# Patient Record
Sex: Male | Born: 1965 | Race: White | Hispanic: No | Marital: Single | Smoking: Current every day smoker
Health system: Southern US, Community
[De-identification: ages and names within clinical notes are randomized; demographics above are authoritative.]

## PROBLEM LIST (undated history)

## (undated) DIAGNOSIS — M1990 Unspecified osteoarthritis, unspecified site: Secondary | ICD-10-CM

## (undated) DIAGNOSIS — K219 Gastro-esophageal reflux disease without esophagitis: Secondary | ICD-10-CM

## (undated) DIAGNOSIS — C801 Malignant (primary) neoplasm, unspecified: Secondary | ICD-10-CM

## (undated) DIAGNOSIS — F419 Anxiety disorder, unspecified: Secondary | ICD-10-CM

## (undated) DIAGNOSIS — F32A Depression, unspecified: Secondary | ICD-10-CM

## (undated) DIAGNOSIS — IMO0002 Reserved for concepts with insufficient information to code with codable children: Secondary | ICD-10-CM

## (undated) DIAGNOSIS — C4490 Unspecified malignant neoplasm of skin, unspecified: Secondary | ICD-10-CM

## (undated) HISTORY — DX: Unspecified malignant neoplasm of skin, unspecified: C44.90

## (undated) HISTORY — DX: Anxiety disorder, unspecified: F41.9

## (undated) HISTORY — DX: Gastro-esophageal reflux disease without esophagitis: K21.9

## (undated) HISTORY — DX: Malignant (primary) neoplasm, unspecified: C80.1

## (undated) HISTORY — DX: Reserved for concepts with insufficient information to code with codable children: IMO0002

## (undated) HISTORY — DX: Depression, unspecified: F32.A

## (undated) HISTORY — PX: TONSILLECTOMY: SUR1361

## (undated) HISTORY — DX: Unspecified osteoarthritis, unspecified site: M19.90

---

## 2007-12-31 ENCOUNTER — Emergency Department: Payer: Self-pay | Admitting: Internal Medicine
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
## 2009-08-09 IMAGING — CT CT HEAD WITHOUT CONTRAST
2 series · 15 of 30 positions shown, 19 images · non-contrast
Comparison: none

REASON FOR EXAM: bruising/pain
COMMENTS:

PROCEDURE:     CT  - CT HEAD WITHOUT CONTRAST  - December 31, 2007  [DATE]
RESULT:     Comparison: No comparison
TECHNIQUE: Multiple axial images from the foramen magnum to the vertex were
obtained without IV contrast.

[Series 2: without · axial · non-contrast · 0.41mm/px · z∈[+688,+824]mm · 13 of 33 slices shown, 17 images]
[im 3/33  brain]
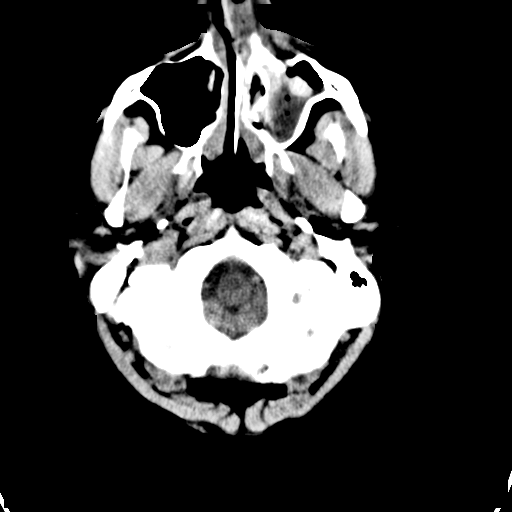
[im 3/33  bone]
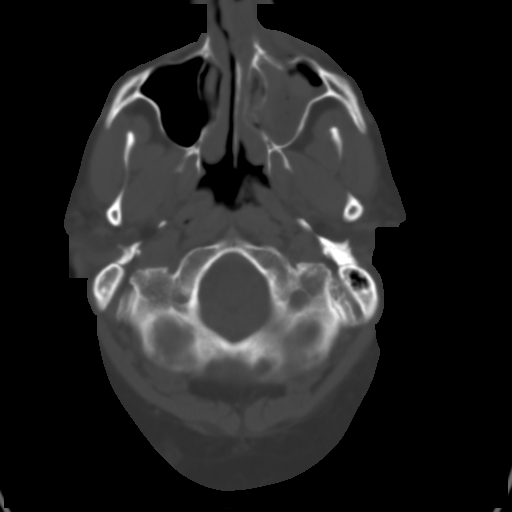
[im 5/33  brain]
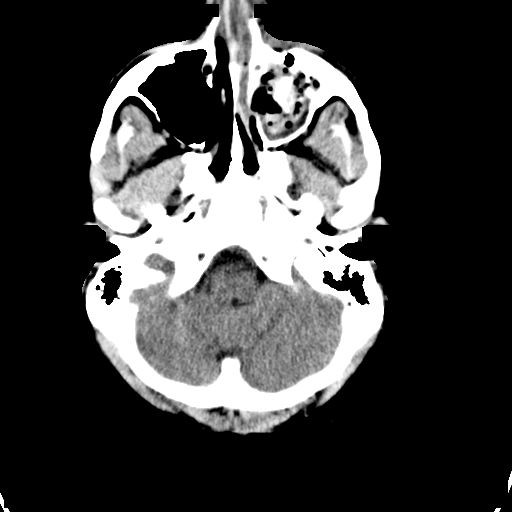
[im 7/33  brain]
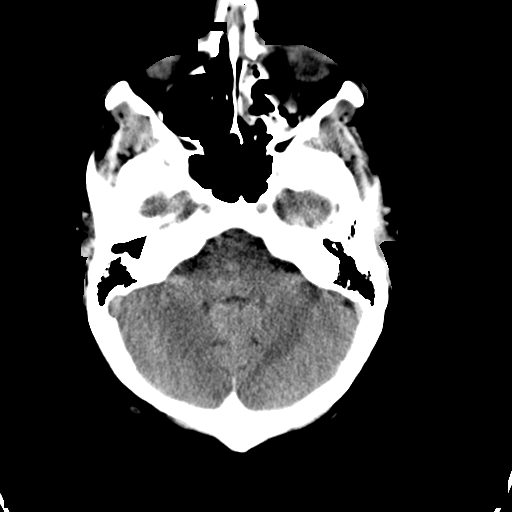
[im 10/33  brain]
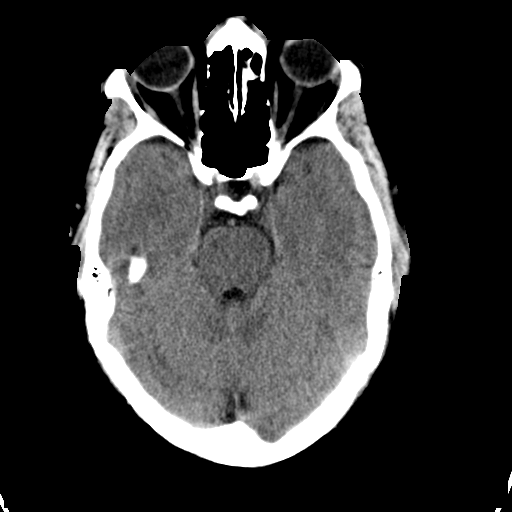
[im 12/33  brain]
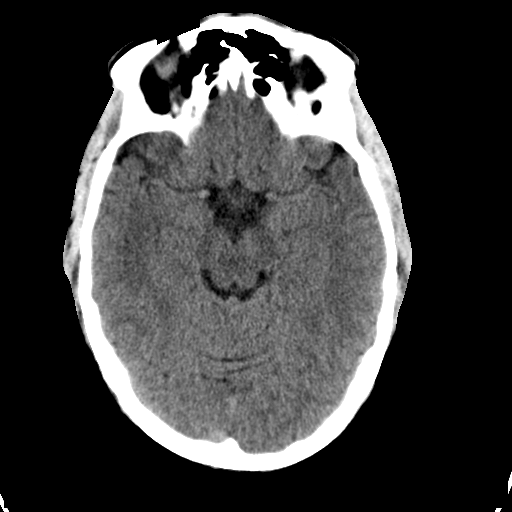
[im 12/33  bone]
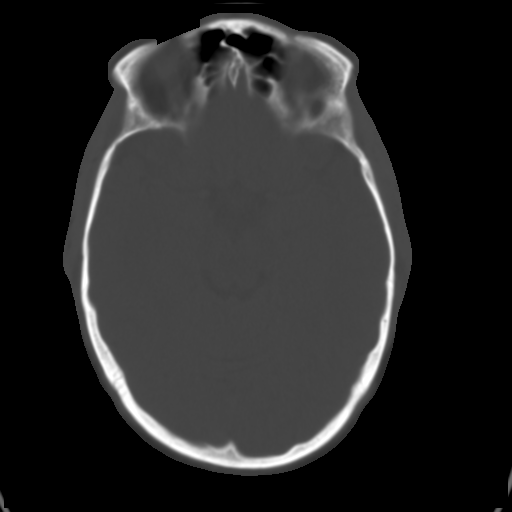
[im 14/33  brain]
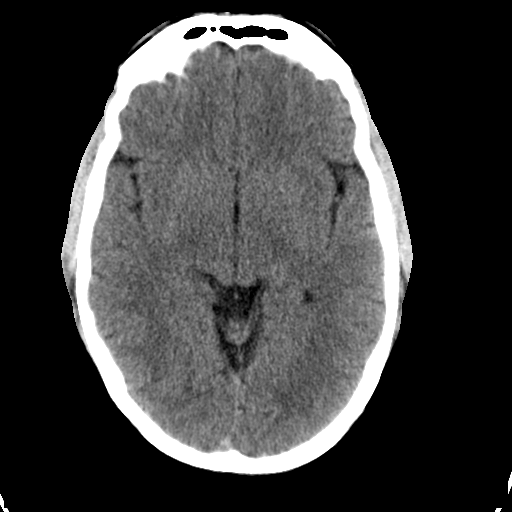
[im 17/33  brain]
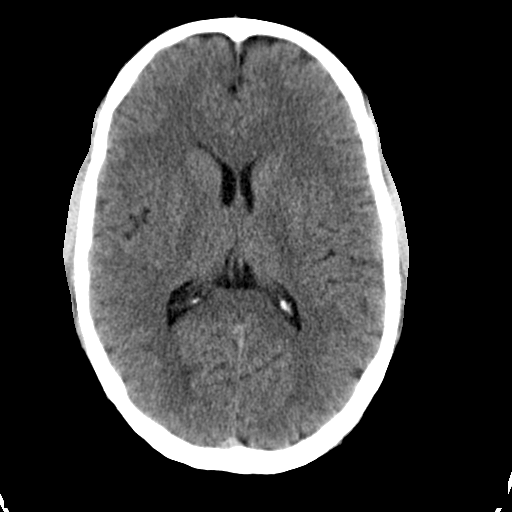
[im 19/33  brain]
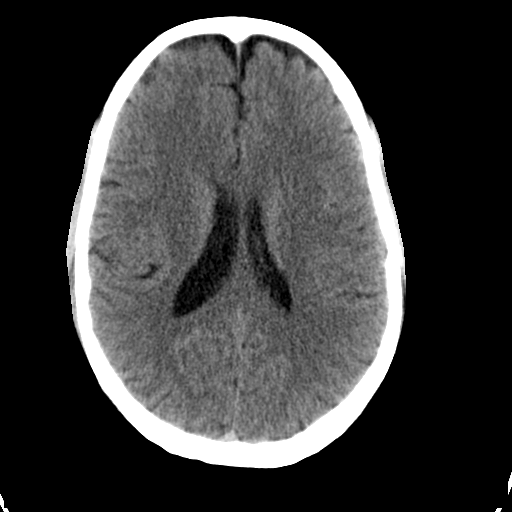
[im 21/33  brain]
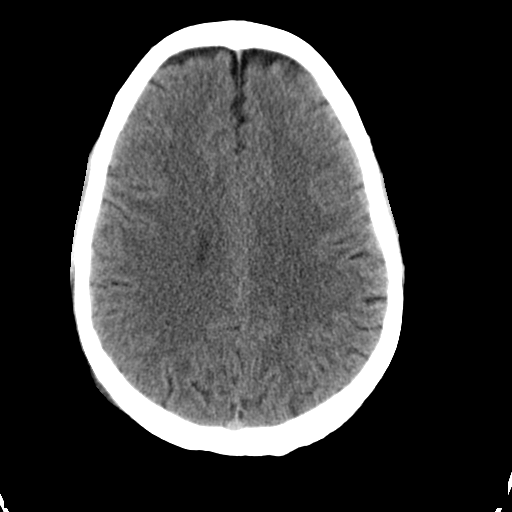
[im 21/33  bone]
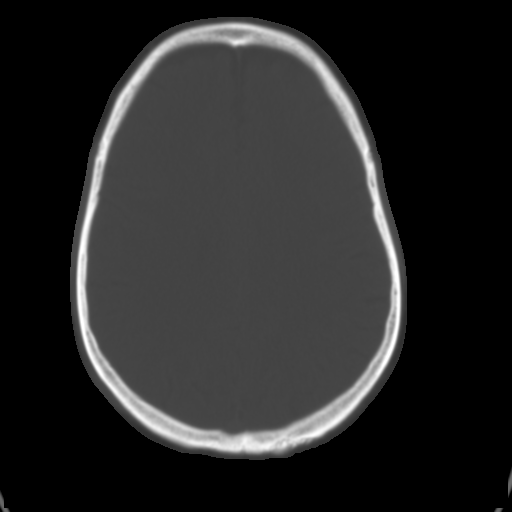
[im 23/33  brain]
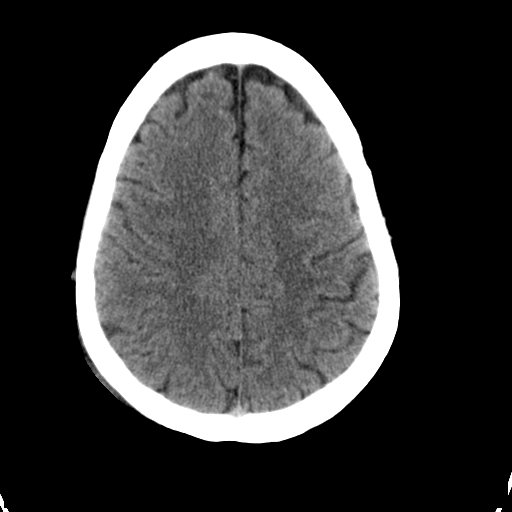
[im 26/33  brain]
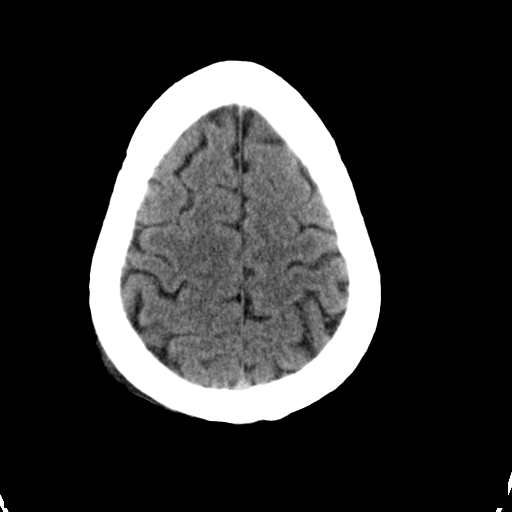
[im 28/33  brain]
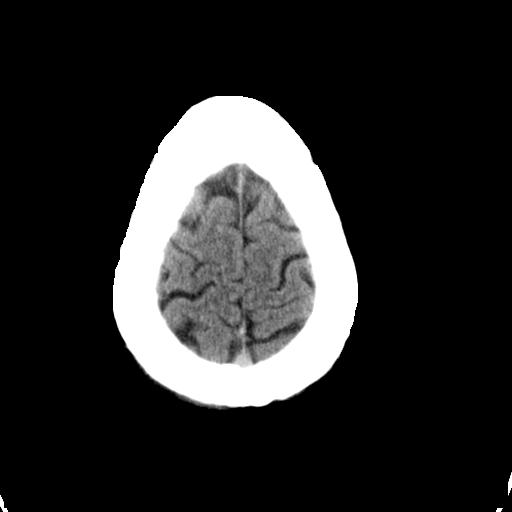
[im 30/33  brain]
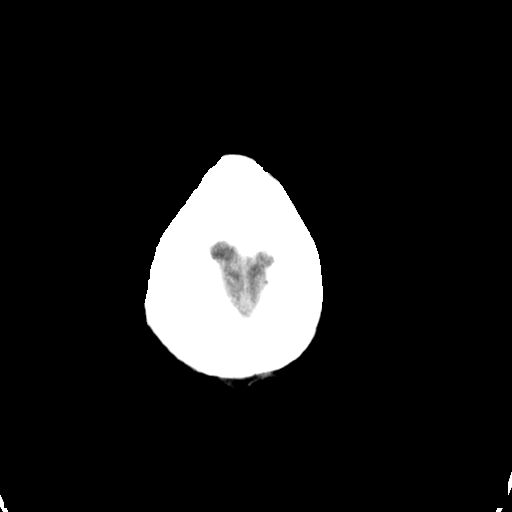
[im 30/33  bone]
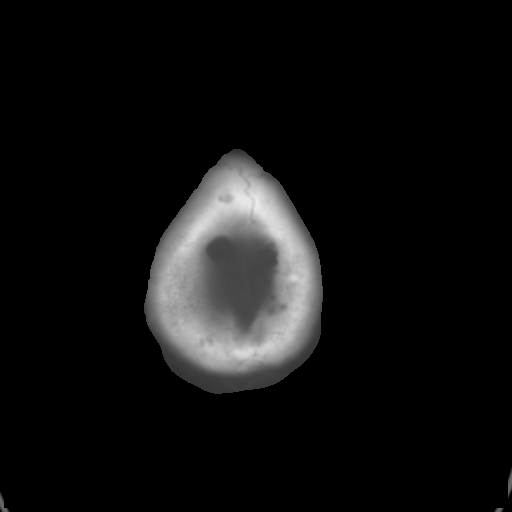

[Series 3: bone · axial · 0.41mm/px · z∈[+688,+708]mm · 2 of 33 slices shown]
[im 3/33  bone]
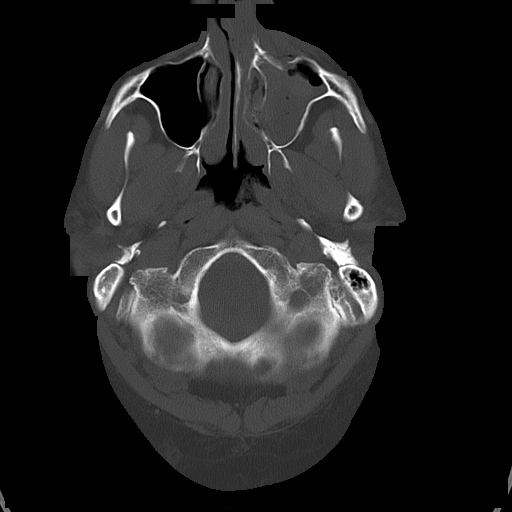
[im 7/33  bone]
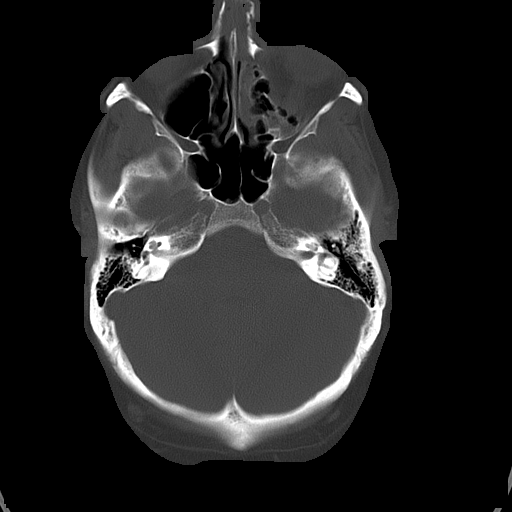

[15 of 30 positions shown; findings below may reference images not displayed]

FINDINGS: There is no evidence for mass effect, midline shift, or extra-axial fluid
collections.  There is no evidence for space-occupying lesion or
intracranial hemorrhage. There is no evidence for a cortical-based area of
acute infarction.

Ventricles and sulci are appropriate for the patient's age. The basal
cisterns are patent.

Visualized portions of the orbits are unremarkable. The paranasal sinuses
and mastoid air cells are unremarkable.

There is a fracture of the medial and anterior left maxillary walls. There
is near-complete opacification of the left maxillary sinus. There is a
fracture of the left nasal bone. There is a right parietal scalp hematoma.
IMPRESSION: No acute intracranial process.

## 2022-09-12 ENCOUNTER — Ambulatory Visit (INDEPENDENT_AMBULATORY_CARE_PROVIDER_SITE_OTHER): Payer: Medicaid Other | Admitting: Physician Assistant

## 2022-09-12 ENCOUNTER — Encounter: Payer: Self-pay | Admitting: Physician Assistant

## 2022-09-12 VITALS — BP 110/80 | HR 73 | Ht 69.0 in | Wt 179.0 lb

## 2022-09-12 DIAGNOSIS — Z13220 Encounter for screening for lipoid disorders: Secondary | ICD-10-CM

## 2022-09-12 DIAGNOSIS — Z0000 Encounter for general adult medical examination without abnormal findings: Secondary | ICD-10-CM

## 2022-09-12 DIAGNOSIS — Z0001 Encounter for general adult medical examination with abnormal findings: Secondary | ICD-10-CM | POA: Diagnosis not present

## 2022-09-12 DIAGNOSIS — F17200 Nicotine dependence, unspecified, uncomplicated: Secondary | ICD-10-CM

## 2022-09-12 DIAGNOSIS — Z114 Encounter for screening for human immunodeficiency virus [HIV]: Secondary | ICD-10-CM

## 2022-09-12 DIAGNOSIS — Z125 Encounter for screening for malignant neoplasm of prostate: Secondary | ICD-10-CM | POA: Diagnosis not present

## 2022-09-12 DIAGNOSIS — Z1159 Encounter for screening for other viral diseases: Secondary | ICD-10-CM | POA: Diagnosis not present

## 2022-09-12 DIAGNOSIS — Z122 Encounter for screening for malignant neoplasm of respiratory organs: Secondary | ICD-10-CM

## 2022-09-12 DIAGNOSIS — Z1211 Encounter for screening for malignant neoplasm of colon: Secondary | ICD-10-CM

## 2022-09-12 DIAGNOSIS — Z0120 Encounter for dental examination and cleaning without abnormal findings: Secondary | ICD-10-CM

## 2022-09-12 DIAGNOSIS — F314 Bipolar disorder, current episode depressed, severe, without psychotic features: Secondary | ICD-10-CM

## 2022-09-12 DIAGNOSIS — Z1321 Encounter for screening for nutritional disorder: Secondary | ICD-10-CM | POA: Diagnosis not present

## 2022-09-12 DIAGNOSIS — F322 Major depressive disorder, single episode, severe without psychotic features: Secondary | ICD-10-CM

## 2022-09-12 MED ORDER — BUPROPION HCL ER (SR) 150 MG PO TB12
150.0000 mg | ORAL_TABLET | Freq: Two times a day (BID) | ORAL | 2 refills | Status: DC
Start: 2022-09-12 — End: 2022-09-26

## 2022-09-12 NOTE — Progress Notes (Unsigned)
    Date:  09/12/2022   Name:  Frank Kelley   DOB:  1965/09/16   MRN:  [id number]   Chief Complaint: Establish Care  HPI    Medication list has been reviewed and updated.  No outpatient medications have been marked as taking for the 09/12/22 encounter (Office Visit) with Remo Lipps, PA.     Review of Systems  There are no problems to display for this patient.   Not on File   There is no immunization history on file for this patient.  History reviewed. No pertinent surgical history.  Social History   Tobacco Use   Smoking status: Every Day    Current packs/day: 1.00    Average packs/day: 1 pack/day for 43.0 years (43.0 ttl pk-yrs)    Types: Cigarettes    History reviewed. No pertinent family history.       No data to display              No data to display          BP Readings from Last 3 Encounters:  No data found for BP    Wt Readings from Last 3 Encounters:  No data found for Wt    There were no vitals taken for this visit.  Physical Exam  Recent Labs  No results found for: "NA", "K", "CL", "CO2", "GLUCOSE", "BUN", "CREATININE", "CALCIUM", "PROT", "ALBUMIN", "AST", "ALT", "ALKPHOS", "BILITOT", "GFRNONAA", "GFRAA"  No results found for: "WBC", "HGB", "HCT", "MCV", "PLT" No results found for: "HGBA1C" No results found for: "CHOL", "HDL", "LDLCALC", "LDLDIRECT", "TRIG", "CHOLHDL" No results found for: "TSH"   Assessment and Plan:  There are no diagnoses linked to this encounter.   No follow-ups on file.    Alvester Morin, PA-C, DMSc, Nutritionist [facility] [phone]

## 2022-09-12 NOTE — Patient Instructions (Signed)

## 2022-09-13 ENCOUNTER — Other Ambulatory Visit: Payer: Self-pay | Admitting: Physician Assistant

## 2022-09-13 DIAGNOSIS — F330 Major depressive disorder, recurrent, mild: Secondary | ICD-10-CM | POA: Insufficient documentation

## 2022-09-13 DIAGNOSIS — E782 Mixed hyperlipidemia: Secondary | ICD-10-CM

## 2022-09-13 DIAGNOSIS — F4000 Agoraphobia, unspecified: Secondary | ICD-10-CM | POA: Insufficient documentation

## 2022-09-13 DIAGNOSIS — F322 Major depressive disorder, single episode, severe without psychotic features: Secondary | ICD-10-CM | POA: Insufficient documentation

## 2022-09-13 DIAGNOSIS — F411 Generalized anxiety disorder: Secondary | ICD-10-CM | POA: Insufficient documentation

## 2022-09-13 DIAGNOSIS — F40298 Other specified phobia: Secondary | ICD-10-CM | POA: Insufficient documentation

## 2022-09-13 DIAGNOSIS — D72829 Elevated white blood cell count, unspecified: Secondary | ICD-10-CM | POA: Insufficient documentation

## 2022-09-13 DIAGNOSIS — F314 Bipolar disorder, current episode depressed, severe, without psychotic features: Secondary | ICD-10-CM | POA: Insufficient documentation

## 2022-09-13 DIAGNOSIS — D72828 Other elevated white blood cell count: Secondary | ICD-10-CM

## 2022-09-13 MED ORDER — ATORVASTATIN CALCIUM 10 MG PO TABS
10.0000 mg | ORAL_TABLET | Freq: Every day | ORAL | 1 refills | Status: DC
Start: 2022-09-13 — End: 2022-10-10

## 2022-09-14 ENCOUNTER — Telehealth: Payer: Self-pay | Admitting: Physician Assistant

## 2022-09-14 NOTE — Telephone Encounter (Signed)
Please see previous note.

## 2022-09-26 ENCOUNTER — Telehealth: Payer: Self-pay

## 2022-09-26 ENCOUNTER — Other Ambulatory Visit: Payer: Self-pay | Admitting: Physician Assistant

## 2022-09-26 DIAGNOSIS — F411 Generalized anxiety disorder: Secondary | ICD-10-CM

## 2022-09-26 MED ORDER — HYDROXYZINE PAMOATE 25 MG PO CAPS
25.0000 mg | ORAL_CAPSULE | Freq: Three times a day (TID) | ORAL | 2 refills | Status: DC | PRN
Start: 2022-09-26 — End: 2023-05-23

## 2022-09-26 NOTE — Telephone Encounter (Signed)
Pt called referrals team and left message that he stopped taking Wellbutrin after taking it for 7 days. He stated he did not like the way it made him feel once he started taking 2 tablets instead of one. He stated it made everything worse.   blob:[email or web address] for [email or web address]  KP

## 2022-10-10 ENCOUNTER — Ambulatory Visit (INDEPENDENT_AMBULATORY_CARE_PROVIDER_SITE_OTHER): Payer: Medicaid Other | Admitting: Physician Assistant

## 2022-10-10 ENCOUNTER — Other Ambulatory Visit: Payer: Self-pay

## 2022-10-10 ENCOUNTER — Encounter: Payer: Self-pay | Admitting: Physician Assistant

## 2022-10-10 VITALS — BP 122/68 | HR 68 | Temp 98.1°F | Ht 69.0 in | Wt 180.0 lb

## 2022-10-10 DIAGNOSIS — F330 Major depressive disorder, recurrent, mild: Secondary | ICD-10-CM

## 2022-10-10 DIAGNOSIS — F17200 Nicotine dependence, unspecified, uncomplicated: Secondary | ICD-10-CM

## 2022-10-10 DIAGNOSIS — L570 Actinic keratosis: Secondary | ICD-10-CM | POA: Diagnosis not present

## 2022-10-10 DIAGNOSIS — Z122 Encounter for screening for malignant neoplasm of respiratory organs: Secondary | ICD-10-CM

## 2022-10-10 DIAGNOSIS — Z23 Encounter for immunization: Secondary | ICD-10-CM

## 2022-10-10 DIAGNOSIS — E782 Mixed hyperlipidemia: Secondary | ICD-10-CM | POA: Diagnosis not present

## 2022-10-10 DIAGNOSIS — Z87891 Personal history of nicotine dependence: Secondary | ICD-10-CM

## 2022-10-10 DIAGNOSIS — D1801 Hemangioma of skin and subcutaneous tissue: Secondary | ICD-10-CM | POA: Diagnosis not present

## 2022-10-10 DIAGNOSIS — D72829 Elevated white blood cell count, unspecified: Secondary | ICD-10-CM

## 2022-10-10 DIAGNOSIS — F17210 Nicotine dependence, cigarettes, uncomplicated: Secondary | ICD-10-CM

## 2022-10-10 MED ORDER — ATORVASTATIN CALCIUM 10 MG PO TABS: 10.0000 mg | ORAL_TABLET | Freq: Every day | ORAL | 1 refills | Status: DC

## 2022-10-10 MED ORDER — ATORVASTATIN CALCIUM 10 MG PO TABS
10.0000 mg | ORAL_TABLET | Freq: Every day | ORAL | 1 refills | Status: DC
Start: 2022-10-10 — End: 2022-11-07

## 2022-10-10 NOTE — Progress Notes (Signed)
Date:  10/10/2022   Name:  Frank Kelley   DOB:  Aug 08, 1965   MRN:  [id number]   Chief Complaint: Hyperlipidemia and Anxiety  HPI Frank Kelley presents for 1 month follow-up on mood disorders and smoking.  We started him on bupropion with the hope that it would benefit both his depression and help quit smoking, but unfortunately he was unable to tolerate the medication so we switched to as needed hydroxyzine, which seems to be doing well for the time being.  He does not feel ready to quit smoking.  He does however feel much better about his anxiety and depression compared to last visit.  I also ordered LDCT and Cologuard last visit.  He has the Cologuard kit but has not completed his sample.  He has not heard from pulmonology regarding the scheduling of his LDCT.  He had mild hyperlipidemia on his last labs, started on atorvastatin 10 mg for the cardiovascular benefit given his extensive smoking history.  Tolerating the medication well  He would like dermatology referral for a "bump" on the left upper eyelid as well as probable AK's few places on the body.   Medication list has been reviewed and updated.  Current Meds  Medication Sig   atorvastatin (LIPITOR) 10 MG tablet Take 1 tablet (10 mg total) by mouth daily.   hydrOXYzine (VISTARIL) 25 MG capsule Take 1 capsule (25 mg total) by mouth every 8 (eight) hours as needed for anxiety. Do not take with other antihistamines such as cetirizine, diphenhydramine, loratadine, etc     Review of Systems  Constitutional:  Negative for fatigue and fever.  Respiratory:  Negative for chest tightness and shortness of breath.   Cardiovascular:  Negative for chest pain and palpitations.  Gastrointestinal:  Negative for abdominal pain.  Psychiatric/Behavioral:  Positive for dysphoric mood. The patient is nervous/anxious.     Patient Active Problem List   Diagnosis Date Noted   Current severe episode of major depressive disorder without psychotic  features (HCC) 09/13/2022   Bipolar disorder with severe depression (HCC) 09/13/2022   Generalized anxiety disorder 09/13/2022   Agoraphobia 09/13/2022   Germaphobia 09/13/2022   Leucocytosis 09/13/2022   Mixed hyperlipidemia 09/13/2022   Hypercalcemia 09/13/2022   Tobacco use disorder 09/12/2022    No Known Allergies   There is no immunization history on file for this patient.  Past Surgical History:  Procedure Laterality Date   TONSILLECTOMY      Social History   Tobacco Use   Smoking status: Every Day    Current packs/day: 1.00    Average packs/day: 1 pack/day for 41.7 years (41.7 ttl pk-yrs)    Types: Cigarettes    Start date: 02/14/1981   Smokeless tobacco: Never  Vaping Use   Vaping status: Never Used  Substance Use Topics   Alcohol use: Yes    Comment: occassionally   Drug use: Never    Family History  Problem Relation Age of Onset   Diabetes Mother    Breast cancer Mother    Diabetes Father    Cancer Father    Bladder Cancer Father    Diabetes Brother         10/10/2022    8:27 AM 09/12/2022    1:57 PM  GAD 7 : Generalized Anxiety Score  Nervous, Anxious, on Edge 1 3  Control/stop worrying 1 3  Worry too much - different things 2 3  Trouble relaxing 1 3  Restless 1 3  Easily annoyed or  irritable 2 3  Afraid - awful might happen 1 3  Total GAD 7 Score 9 21  Anxiety Difficulty Somewhat difficult Extremely difficult       10/10/2022    8:26 AM 09/12/2022    1:56 PM  Depression screen PHQ 2/9  Decreased Interest 1 3  Down, Depressed, Hopeless 1 3  PHQ - 2 Score 2 6  Altered sleeping 0 2  Tired, decreased energy 0 2  Change in appetite 0 3  Feeling bad or failure about yourself  1 3  Trouble concentrating 1 3  Moving slowly or fidgety/restless 1 3  Suicidal thoughts 0 1  PHQ-9 Score 5 23  Difficult doing work/chores  Extremely dIfficult    BP Readings from Last 3 Encounters:  10/10/22 122/68  09/12/22 110/80    Wt Readings from Last  3 Encounters:  10/10/22 180 lb (81.6 kg)  09/12/22 179 lb (81.2 kg)    BP 122/68   Pulse 68   Temp 98.1 F (36.7 C) (Oral)   Ht 5\' 9"  (1.753 m)   Wt 180 lb (81.6 kg)   SpO2 98%   BMI 26.58 kg/m   Physical Exam Vitals and nursing note reviewed.  Constitutional:      Appearance: Normal appearance.  Cardiovascular:     Rate and Rhythm: Normal rate.  Pulmonary:     Effort: Pulmonary effort is normal.  Abdominal:     General: There is no distension.  Musculoskeletal:        General: Normal range of motion.  Skin:    General: Skin is warm and dry.     Comments: 2-3 mm purple papule of LEFT upper eyelid (external) bordering the lashes. Scattered red-pink dry lesions probably AK on the right forearm, left shoulder, and midline scalp.   Neurological:     Mental Status: He is alert and oriented to person, place, and time.     Gait: Gait is intact.  Psychiatric:        Mood and Affect: Mood and affect normal.     Recent Labs     Component Value Date/Time   NA 140 09/12/2022 1449   K 4.6 09/12/2022 1449   CL 100 09/12/2022 1449   CO2 26 09/12/2022 1449   GLUCOSE 93 09/12/2022 1449   BUN 18 09/12/2022 1449   CREATININE 0.95 09/12/2022 1449   CALCIUM 10.6 (H) 09/12/2022 1449   PROT 7.2 09/12/2022 1449   ALBUMIN 4.5 09/12/2022 1449   AST 21 09/12/2022 1449   ALT 20 09/12/2022 1449   ALKPHOS 114 09/12/2022 1449   BILITOT <0.2 09/12/2022 1449    Lab Results  Component Value Date   WBC 14.6 (H) 09/12/2022   HGB 16.7 09/12/2022   HCT 49.9 09/12/2022   MCV 88 09/12/2022   PLT 256 09/12/2022   No results found for: "HGBA1C" Lab Results  Component Value Date   CHOL 210 (H) 09/12/2022   HDL 54 09/12/2022   LDLCALC 133 (H) 09/12/2022   TRIG 128 09/12/2022   CHOLHDL 3.9 09/12/2022   Lab Results  Component Value Date   TSH 1.430 09/12/2022     Assessment and Plan:  1. Major depressive disorder, recurrent episode, mild (HCC) Much improved from previous despite  no significant changes in medication.  Sounds like it was mainly situational regarding initial visit to primary care.  Discussed option for pharmacotherapy with SSRI/SNRI, but patient would like to hold off at this time.  Will continue hydroxyzine for as  needed use to control anxiety.  2. Tobacco use disorder Patient does not believe he is ready to quit.  Smoking cessation education given.  Reminded that we can try NRT at any time  3. Mixed hyperlipidemia Continue atorvastatin as below - atorvastatin (LIPITOR) 10 MG tablet; Take 1 tablet (10 mg total) by mouth daily.  Dispense: 90 tablet; Refill: 1  4. Leukocytosis, unspecified type Repeat CBC today - CBC with Differential/Platelet  5. Hypercalcemia Repeat BMP today - Basic metabolic panel  6. Actinic keratoses Refer to dermatology for additional evaluation and management - Ambulatory referral to Dermatology  7. Hemangioma of eyelid Refer to dermatology for additional evaluation and management - Ambulatory referral to Dermatology  8. Need for Tdap vaccination Tdap booster completed today - Tdap vaccine greater than or equal to 7yo IM  9. Need for pneumococcal 20-valent conjugate vaccination Prevnar 20 administered today - Pneumococcal conjugate vaccine 20-valent    Follow-up in about 1 month for chronic conditions  Alvester Morin, PA-C, DMSc, Nutritionist [facility] [phone]

## 2022-10-10 NOTE — Patient Instructions (Signed)
-  It was a pleasure to see you today! Please review your visit summary for helpful information -Lab results are usually available within 1-2 days and we will call once reviewed -I would encourage you to follow your care via MyChart where you can access lab results, notes, messages, and more -If you feel that we did a nice job today, please complete your after-visit survey and leave us a Google review! Your CMA today was Kieandra and your provider was Dan Waddell, PA-C, DMSc -Please return for follow-up in about 1 month  

## 2022-10-11 ENCOUNTER — Other Ambulatory Visit: Payer: Self-pay | Admitting: Physician Assistant

## 2022-10-11 DIAGNOSIS — D72829 Elevated white blood cell count, unspecified: Secondary | ICD-10-CM

## 2022-10-11 LAB — CBC WITH DIFFERENTIAL/PLATELET
Basophils Absolute: 0.1 10*3/uL (ref 0.0–0.2)
Basos: 0 %
EOS (ABSOLUTE): 0.1 10*3/uL (ref 0.0–0.4)
Eos: 1 %
Hematocrit: 48.9 % (ref 37.5–51.0)
Hemoglobin: 15.7 g/dL (ref 13.0–17.7)
Immature Grans (Abs): 0 10*3/uL (ref 0.0–0.1)
Immature Granulocytes: 0 %
Lymphocytes Absolute: 2.5 10*3/uL (ref 0.7–3.1)
Lymphs: 19 %
MCH: 28.5 pg (ref 26.6–33.0)
MCHC: 32.1 g/dL (ref 31.5–35.7)
MCV: 89 fL (ref 79–97)
Monocytes Absolute: 0.6 10*3/uL (ref 0.1–0.9)
Monocytes: 5 %
Neutrophils Absolute: 10.1 10*3/uL — ABNORMAL HIGH (ref 1.4–7.0)
Neutrophils: 75 %
Platelets: 229 10*3/uL (ref 150–450)
RBC: 5.5 x10E6/uL (ref 4.14–5.80)
RDW: 12.2 % (ref 11.6–15.4)
WBC: 13.5 10*3/uL — ABNORMAL HIGH (ref 3.4–10.8)

## 2022-10-11 LAB — BASIC METABOLIC PANEL
BUN/Creatinine Ratio: 17 (ref 9–20)
BUN: 16 mg/dL (ref 6–24)
CO2: 26 mmol/L (ref 20–29)
Calcium: 9.4 mg/dL (ref 8.7–10.2)
Chloride: 102 mmol/L (ref 96–106)
Creatinine, Ser: 0.92 mg/dL (ref 0.76–1.27)
Glucose: 92 mg/dL (ref 70–99)
Potassium: 4.9 mmol/L (ref 3.5–5.2)
Sodium: 140 mmol/L (ref 134–144)
eGFR: 98 mL/min/{1.73_m2} (ref >59)

## 2022-10-13 ENCOUNTER — Encounter: Payer: Self-pay | Admitting: Physician Assistant

## 2022-10-13 ENCOUNTER — Ambulatory Visit: Payer: Medicaid Other | Admitting: Physician Assistant

## 2022-10-13 DIAGNOSIS — F17210 Nicotine dependence, cigarettes, uncomplicated: Secondary | ICD-10-CM

## 2022-10-13 NOTE — Progress Notes (Signed)
Virtual Visit via Telephone Note  I connected withNAME@ on 10/13/22 at  9:00 AM EDT by telephone and verified that I am speaking with the correct person using two identifiers.  Location: Patient: Home Provider: Remotely   I discussed the limitations, risks, security and privacy concerns of performing an evaluation and management service by telephone and the availability of in person appointments. I also discussed with the patient that there may be a patient responsible charge related to this service. The patient expressed understanding and agreed to proceed.    Shared Decision Making Visit Lung Cancer Screening Program [phone])   Eligibility: Age 57 y.o. Pack Years Smoking History Calculation 41 (# packs/per year x # years smoked) Recent History of coughing up blood  No Unexplained weight loss? No ( >Than 15 pounds within the last 6 months ) Prior History Lung / other cancer no (Diagnosis within the last 5 years already requiring surveillance chest CT Scans). Smoking Status Current Smoker  Visit Components:     Discussion included one or more decision making aids. Yes Discussion included risk/benefits of screening. Yes Discussion included potential follow up diagnostic testing for abnormal scans. Yes Discussion included meaning and risk of over diagnosis. Yes Discussion included meaning and risk of False Positives. Yes Discussion included meaning of total radiation exposure. Yes  Counseling Included: Importance of adherence to annual lung cancer LDCT screening. Yes Impact of comorbidities on ability to participate in the program. Yes Ability and willingness to under diagnostic treatment. Yes  Smoking Cessation Counseling: Current Smokers:  Discussed importance of smoking cessation. Yes Information about tobacco cessation classes and interventions provided to patient. Yes Symptomatic Patient. No  Counseling: NA Diagnosis Code: Tobacco Use Z72.0 Asymptomatic Patient  Yes   Counseling (Intermediate counseling: > three minutes counseling) [id number] Written Order for Lung Cancer Screening with LDCT placed in Epic. Yes (CT Chest Lung Cancer Screening Low Dose W/O CM) [id number] Z12.2-Screening of respiratory organs Z87.891-Personal history of nicotine dependence    Thereasa Iannello Celine Mans, PA-C

## 2022-10-13 NOTE — Patient Instructions (Addendum)

## 2022-10-18 ENCOUNTER — Inpatient Hospital Stay: Payer: Medicaid Other | Attending: Oncology | Admitting: Oncology

## 2022-10-18 ENCOUNTER — Encounter: Payer: Self-pay | Admitting: Oncology

## 2022-10-18 ENCOUNTER — Telehealth: Payer: Self-pay

## 2022-10-18 ENCOUNTER — Inpatient Hospital Stay: Payer: Medicaid Other

## 2022-10-18 VITALS — BP 125/89 | HR 63 | Temp 97.3°F | Resp 18 | Wt 181.1 lb

## 2022-10-18 DIAGNOSIS — Z809 Family history of malignant neoplasm, unspecified: Secondary | ICD-10-CM

## 2022-10-18 DIAGNOSIS — Z803 Family history of malignant neoplasm of breast: Secondary | ICD-10-CM | POA: Insufficient documentation

## 2022-10-18 DIAGNOSIS — Z5941 Food insecurity: Secondary | ICD-10-CM | POA: Insufficient documentation

## 2022-10-18 DIAGNOSIS — Z79899 Other long term (current) drug therapy: Secondary | ICD-10-CM | POA: Diagnosis not present

## 2022-10-18 DIAGNOSIS — D72829 Elevated white blood cell count, unspecified: Secondary | ICD-10-CM | POA: Insufficient documentation

## 2022-10-18 DIAGNOSIS — Z9089 Acquired absence of other organs: Secondary | ICD-10-CM | POA: Diagnosis not present

## 2022-10-18 DIAGNOSIS — F17200 Nicotine dependence, unspecified, uncomplicated: Secondary | ICD-10-CM

## 2022-10-18 DIAGNOSIS — Z5982 Transportation insecurity: Secondary | ICD-10-CM

## 2022-10-18 DIAGNOSIS — Z833 Family history of diabetes mellitus: Secondary | ICD-10-CM

## 2022-10-18 DIAGNOSIS — Z8052 Family history of malignant neoplasm of bladder: Secondary | ICD-10-CM

## 2022-10-18 DIAGNOSIS — F17210 Nicotine dependence, cigarettes, uncomplicated: Secondary | ICD-10-CM | POA: Insufficient documentation

## 2022-10-18 LAB — HEPATITIS B SURFACE ANTIGEN: Hepatitis B Surface Ag: NONREACTIVE

## 2022-10-18 LAB — CBC WITH DIFFERENTIAL/PLATELET
Abs Immature Granulocytes: 0.04 10*3/uL (ref 0.00–0.07)
Basophils Absolute: 0.1 10*3/uL (ref 0.0–0.1)
Basophils Relative: 1 %
Eosinophils Absolute: 0.1 10*3/uL (ref 0.0–0.5)
Eosinophils Relative: 1 %
HCT: 50.9 % (ref 39.0–52.0)
Hemoglobin: 16.3 g/dL (ref 13.0–17.0)
Immature Granulocytes: 0 %
Lymphocytes Relative: 21 %
Lymphs Abs: 2.8 10*3/uL (ref 0.7–4.0)
MCH: 29.2 pg (ref 26.0–34.0)
MCHC: 32 g/dL (ref 30.0–36.0)
MCV: 91.2 fL (ref 80.0–100.0)
Monocytes Absolute: 0.4 10*3/uL (ref 0.1–1.0)
Monocytes Relative: 3 %
Neutro Abs: 9.7 10*3/uL — ABNORMAL HIGH (ref 1.7–7.7)
Neutrophils Relative %: 74 %
Platelets: 224 10*3/uL (ref 150–400)
RBC: 5.58 MIL/uL (ref 4.22–5.81)
RDW: 13.6 % (ref 11.5–15.5)
Smear Review: NORMAL
WBC: 13.2 10*3/uL — ABNORMAL HIGH (ref 4.0–10.5)
nRBC: 0 % (ref 0.0–0.2)

## 2022-10-18 LAB — HIV ANTIBODY (ROUTINE TESTING W REFLEX): HIV Screen 4th Generation wRfx: NONREACTIVE

## 2022-10-18 LAB — LACTATE DEHYDROGENASE: LDH: 125 U/L (ref 98–192)

## 2022-10-18 LAB — HEPATITIS B CORE ANTIBODY, IGM: Hep B C IgM: NONREACTIVE

## 2022-10-18 NOTE — Assessment & Plan Note (Signed)
Recommend smoke cessation.  He is scheduled to have CT chest lung cancer screening next week.

## 2022-10-18 NOTE — Assessment & Plan Note (Signed)
Labs reviewed and discussed with patient that Leukocytosis, differential diagnosis is broad, acute or chronic infection and inflammation, smoking, autoimmune disease, or underlying bone marrow disorders.   For the work up of patient's leukocytosis, I recommend checking CBC;CMP, LDH, smear review, peripheral flowcytometry, hepatitis, most likely leukocytosis is reactive due to extensive smoking.

## 2022-10-18 NOTE — Progress Notes (Signed)
Hematology/Oncology Consult Note [phone] [phone]     REFERRING PROVIDER: Remo Lipps, PA   CHIEF COMPLAINTS/REASON FOR VISIT:  Evaluation of leukocytosis  ASSESSMENT & PLAN:   Tobacco use disorder Recommend smoke cessation.  He is scheduled to have CT chest lung cancer screening next week.  Leucocytosis Labs reviewed and discussed with patient that Leukocytosis, differential diagnosis is broad, acute or chronic infection and inflammation, smoking, autoimmune disease, or underlying bone marrow disorders.   For the work up of patient's leukocytosis, I recommend checking CBC;CMP, LDH, smear review, peripheral flowcytometry, hepatitis, most likely leukocytosis is reactive due to extensive smoking.   Orders Placed This Encounter  Procedures   CBC with Differential/Platelet    Standing Status:   Future    Number of Occurrences:   1    Standing Expiration Date:   10/18/2023   Flow cytometry panel-leukemia/lymphoma work-up    Standing Status:   Future    Number of Occurrences:   1    Standing Expiration Date:   10/18/2023   Lactate dehydrogenase    Standing Status:   Future    Number of Occurrences:   1    Standing Expiration Date:   10/18/2023   HIV Antibody (routine testing w rflx)    Standing Status:   Future    Number of Occurrences:   1    Standing Expiration Date:   10/18/2023   Hepatitis B surface antigen    Standing Status:   Future    Number of Occurrences:   1    Standing Expiration Date:   10/18/2023   Hepatitis B core antibody, IgM    Standing Status:   Future    Number of Occurrences:   1    Standing Expiration Date:   10/18/2023   Ambulatory referral to Social Work    Referral Priority:   Routine    Referral Type:   Consultation    Referral Reason:   Specialty Services Required    Number of Visits Requested:   1    Return of visit: 3-4 weeks to discuss results.  Cc Remo Lipps, PA All questions were answered. The patient  knows to call the clinic with any problems, questions or concerns.  Rickard Patience, MD, PhD [facility] Oncology 10/18/2022    HISTORY OF PRESENTING ILLNESS:  Jaiceion Hagen is a  57 y.o.  male with PMH listed below who was referred to me for evaluation of leukocytosis Reviewed patient' recent labs obtained by PCP.   10/10/22 CBC showed elevated white count of 13.5, predominantly neutrophilia, absolute neutrophils 10.1 Patient did not have remote baseline white count relative compared.  09/12/2022 white count 14.6 with ANC of 10.5. Patient reports that since he has been on disability for shoulder issue, he has lost about 10 to 20 pounds placed over the past 1 year.  He attributes to being less active, eating less and a muscle loss.  Patient denies ever, chills,night sweats.   Patient has extensive smoking history.  He is scheduled to have a CT chest for lung cancer screening next week. Denies history of recent oral steroid use or steroid injection:  Denies recent infection, denies sinus congestion, cough, urinary frequency/urgency or dysuria, diarrhea, joint swelling/pain or abnormal skin rash, prosthetics. Denies autoimmune disease history.      MEDICAL HISTORY:  Past Medical History:  Diagnosis Date   Anxiety    Arthritis    Depression    GERD (gastroesophageal reflux disease)  Ulcer     SURGICAL HISTORY: Past Surgical History:  Procedure Laterality Date   TONSILLECTOMY      SOCIAL HISTORY: Social History   Socioeconomic History   Marital status: Single    Spouse name: Not on file   Number of children: 0   Years of education: Not on file   Highest education level: Not on file  Occupational History   Not on file  Tobacco Use   Smoking status: Every Day    Current packs/day: 1.00    Average packs/day: 1 pack/day for 41.7 years (41.7 ttl pk-yrs)    Types: Cigarettes    Start date: 02/14/1981   Smokeless tobacco: Never  Vaping Use   Vaping status: Never Used   Substance and Sexual Activity   Alcohol use: Yes    Comment: occassionally   Drug use: Never   Sexual activity: Not Currently  Other Topics Concern   Not on file  Social History Narrative   Not on file   Social Determinants of Health   Financial Resource Strain: Not on file  Food Insecurity: Food Insecurity Present (10/18/2022)   Hunger Vital Sign    Worried About Running Out of Food in the Last Year: Sometimes true    Ran Out of Food in the Last Year: Sometimes true  Transportation Needs: Unmet Transportation Needs (10/18/2022)   PRAPARE - Administrator, Civil Service (Medical): Yes    Lack of Transportation (Non-Medical): Yes  Physical Activity: Not on file  Stress: Not on file  Social Connections: Not on file  Intimate Partner Violence: Not At Risk (10/18/2022)   Humiliation, Afraid, Rape, and Kick questionnaire    Fear of Current or Ex-Partner: No    Emotionally Abused: No    Physically Abused: No    Sexually Abused: No    FAMILY HISTORY: Family History  Problem Relation Age of Onset   Diabetes Mother    Breast cancer Mother    Diabetes Father    Cancer Father    Bladder Cancer Father    Diabetes Brother     ALLERGIES:  has No Known Allergies.  MEDICATIONS:  Current Outpatient Medications  Medication Sig Dispense Refill   atorvastatin (LIPITOR) 10 MG tablet Take 1 tablet (10 mg total) by mouth daily. 90 tablet 1   hydrOXYzine (VISTARIL) 25 MG capsule Take 1 capsule (25 mg total) by mouth every 8 (eight) hours as needed for anxiety. Do not take with other antihistamines such as cetirizine, diphenhydramine, loratadine, etc (Patient not taking: Reported on 10/18/2022) 30 capsule 2   No current facility-administered medications for this visit.    Review of Systems  Constitutional:  Negative for appetite change, chills, fatigue, fever and unexpected weight change.  HENT:   Negative for hearing loss and voice change.   Eyes:  Negative for eye problems and  icterus.  Respiratory:  Negative for chest tightness, cough and shortness of breath.   Cardiovascular:  Negative for chest pain and leg swelling.  Gastrointestinal:  Negative for abdominal distention and abdominal pain.  Endocrine: Negative for hot flashes.  Genitourinary:  Negative for difficulty urinating, dysuria and frequency.   Musculoskeletal:  Positive for arthralgias.  Skin:  Negative for itching and rash.  Neurological:  Negative for light-headedness and numbness.  Hematological:  Negative for adenopathy. Does not bruise/bleed easily.  Psychiatric/Behavioral:  Negative for confusion.     PHYSICAL EXAMINATION: ECOG PERFORMANCE STATUS: 1 - Symptomatic but completely ambulatory Vitals:   10/18/22 1610  BP: 125/89  Pulse: 63  Resp: 18  Temp: (!) 97.3 F (36.3 C)  SpO2: 98%   Filed Weights   10/18/22 0929  Weight: 181 lb 1.6 oz (82.1 kg)    Physical Exam Constitutional:      General: He is not in acute distress. HENT:     Head: Normocephalic and atraumatic.  Eyes:     General: No scleral icterus. Cardiovascular:     Rate and Rhythm: Normal rate and regular rhythm.     Heart sounds: Normal heart sounds.  Pulmonary:     Effort: Pulmonary effort is normal. No respiratory distress.     Breath sounds: No wheezing.  Abdominal:     General: Bowel sounds are normal. There is no distension.     Palpations: Abdomen is soft.  Musculoskeletal:        General: No deformity. Normal range of motion.     Cervical back: Normal range of motion and neck supple.  Skin:    General: Skin is warm and dry.     Findings: No erythema or rash.  Neurological:     Mental Status: He is alert and oriented to person, place, and time. Mental status is at baseline.     Cranial Nerves: No cranial nerve deficit.     Coordination: Coordination normal.  Psychiatric:        Mood and Affect: Mood normal.        RADIOGRAPHIC STUDIES: I have personally reviewed the radiological images as  listed and agreed with the findings in the report. No results found.  LABORATORY DATA:  I have reviewed the data as listed    Latest Ref Rng & Units 10/18/2022   10:18 AM 10/10/2022   10:07 AM 09/12/2022    2:49 PM  CBC  WBC 4.0 - 10.5 K/uL 13.2  13.5  14.6   Hemoglobin 13.0 - 17.0 g/dL 09.8  11.9  14.7   Hematocrit 39.0 - 52.0 % 50.9  48.9  49.9   Platelets 150 - 400 K/uL 224  229  256       Latest Ref Rng & Units 10/10/2022   10:07 AM 09/12/2022    2:49 PM  CMP  Glucose 70 - 99 mg/dL 92  93   BUN 6 - 24 mg/dL 16  18   Creatinine 8.29 - 1.27 mg/dL 5.62  1.30   Sodium 865 - 144 mmol/L 140  140   Potassium 3.5 - 5.2 mmol/L 4.9  4.6   Chloride 96 - 106 mmol/L 102  100   CO2 20 - 29 mmol/L 26  26   Calcium 8.7 - 10.2 mg/dL 9.4  78.4   Total Protein 6.0 - 8.5 g/dL  7.2   Total Bilirubin 0.0 - 1.2 mg/dL  <6.9   Alkaline Phos 44 - 121 IU/L  114   AST 0 - 40 IU/L  21   ALT 0 - 44 IU/L  20

## 2022-10-18 NOTE — Telephone Encounter (Signed)
 Clinical Social Work was referred by medical provider for assessment of psychosocial needs.  CSW attempted to contact patient by phone.  Left voicemail with contact information and request for return call.

## 2022-10-21 LAB — COMP PANEL: LEUKEMIA/LYMPHOMA

## 2022-10-24 ENCOUNTER — Ambulatory Visit
Admission: RE | Admit: 2022-10-24 | Discharge: 2022-10-24 | Disposition: A | Discharge status: Home / Self Care | Payer: Medicaid Other | Source: Ambulatory Visit | Attending: Acute Care | Admitting: Acute Care

## 2022-10-24 DIAGNOSIS — Z87891 Personal history of nicotine dependence: Secondary | ICD-10-CM

## 2022-10-24 DIAGNOSIS — Z122 Encounter for screening for malignant neoplasm of respiratory organs: Secondary | ICD-10-CM

## 2022-10-24 DIAGNOSIS — F17210 Nicotine dependence, cigarettes, uncomplicated: Secondary | ICD-10-CM | POA: Diagnosis not present

## 2022-11-04 ENCOUNTER — Other Ambulatory Visit: Payer: Self-pay | Admitting: Acute Care

## 2022-11-04 DIAGNOSIS — Z122 Encounter for screening for malignant neoplasm of respiratory organs: Secondary | ICD-10-CM

## 2022-11-04 DIAGNOSIS — F17210 Nicotine dependence, cigarettes, uncomplicated: Secondary | ICD-10-CM

## 2022-11-04 DIAGNOSIS — Z87891 Personal history of nicotine dependence: Secondary | ICD-10-CM

## 2022-11-07 ENCOUNTER — Ambulatory Visit (INDEPENDENT_AMBULATORY_CARE_PROVIDER_SITE_OTHER): Payer: Medicaid Other | Admitting: Physician Assistant

## 2022-11-07 ENCOUNTER — Ambulatory Visit
Admission: RE | Admit: 2022-11-07 | Discharge: 2022-11-07 | Disposition: A | Discharge status: Home / Self Care | Payer: Medicaid Other | Source: Ambulatory Visit | Attending: Physician Assistant | Admitting: Physician Assistant

## 2022-11-07 ENCOUNTER — Encounter: Payer: Self-pay | Admitting: Physician Assistant

## 2022-11-07 ENCOUNTER — Ambulatory Visit
Admission: RE | Admit: 2022-11-07 | Discharge: 2022-11-07 | Disposition: A | Discharge status: Home / Self Care | Payer: Medicaid Other | Attending: Physician Assistant | Admitting: Physician Assistant

## 2022-11-07 VITALS — BP 100/76 | HR 85 | Temp 97.9°F | Ht 69.0 in | Wt 179.0 lb

## 2022-11-07 DIAGNOSIS — E782 Mixed hyperlipidemia: Secondary | ICD-10-CM | POA: Diagnosis not present

## 2022-11-07 DIAGNOSIS — R918 Other nonspecific abnormal finding of lung field: Secondary | ICD-10-CM

## 2022-11-07 DIAGNOSIS — Z1211 Encounter for screening for malignant neoplasm of colon: Secondary | ICD-10-CM | POA: Diagnosis not present

## 2022-11-07 DIAGNOSIS — J439 Emphysema, unspecified: Secondary | ICD-10-CM | POA: Diagnosis not present

## 2022-11-07 DIAGNOSIS — M25511 Pain in right shoulder: Secondary | ICD-10-CM | POA: Diagnosis not present

## 2022-11-07 DIAGNOSIS — M79631 Pain in right forearm: Secondary | ICD-10-CM | POA: Insufficient documentation

## 2022-11-07 DIAGNOSIS — I2510 Atherosclerotic heart disease of native coronary artery without angina pectoris: Secondary | ICD-10-CM | POA: Diagnosis not present

## 2022-11-07 DIAGNOSIS — M19011 Primary osteoarthritis, right shoulder: Secondary | ICD-10-CM | POA: Insufficient documentation

## 2022-11-07 DIAGNOSIS — I700 Atherosclerosis of aorta: Secondary | ICD-10-CM | POA: Insufficient documentation

## 2022-11-07 DIAGNOSIS — Z23 Encounter for immunization: Secondary | ICD-10-CM | POA: Diagnosis not present

## 2022-11-07 MED ORDER — ATORVASTATIN CALCIUM 20 MG PO TABS: 20.0000 mg | ORAL_TABLET | Freq: Every day | ORAL | 1 refills | Status: DC

## 2022-11-07 NOTE — Patient Instructions (Signed)
-  It was a pleasure to see you today! Please review your visit summary for helpful information -I would encourage you to follow your care via MyChart where you can access lab results, notes, messages, and more -If you feel that we did a nice job today, please complete your after-visit survey and leave [place] a Google review! Your CMA today was Mariann Barter and your provider was Alvester Morin, PA-C, DMSc -Please return for follow-up in about 2 months

## 2022-11-07 NOTE — Progress Notes (Signed)
Date:  11/07/2022   Name:  Frank Kelley   DOB:  06/28/1965   MRN:  [id number]   Chief Complaint: Depression, Hyperlipidemia, Nicotine Dependence (Having a bad cough at night, is going to try to stop smoking ), and Shoulder Pain (Wants to get an xray )  HPI Frank Kelley presents for 1 month follow-up on chronic conditions today.  Last visit we discussed his depression and anxiety, currently managed with as needed hydroxyzine alone.  Since our last visit he completed his LDCT which showed small pulmonary nodules, emphysematous changes, mild aortic atherosclerosis, and mild coronary artery calcifications.  He is tolerating atorvastatin 10 mg daily and is due for refill.  Patient is working on smoking cessation, says he has 1 carton left and he will be finished, does not want any nicotine replacement at this time.  Dropped off Cologuard this morning.  We have also been monitoring his leukocytosis which could be multifactorial with chronic smoking and chronic dental inflammation.  He has consulted with Dr. Cathie Hoops (hematology) who drew labs at their visit 10/18/2022 which were unremarkable aside from the persistent leukocytosis at 13.2.  Follow-up appt with Dr. Cathie Hoops 11/15/2022  He also mentions ongoing right forearm and right shoulder pain for the past 18 months or so, says they found some arthritis on his last x-rays, would like to repeat.  I do not see any record of prior imaging on the right arm or shoulder, but patient will try to send the radiology report to me.  Medication list has been reviewed and updated.  Current Meds  Medication Sig   hydrOXYzine (VISTARIL) 25 MG capsule Take 1 capsule (25 mg total) by mouth every 8 (eight) hours as needed for anxiety. Do not take with other antihistamines such as cetirizine, diphenhydramine, loratadine, etc   [DISCONTINUED] atorvastatin (LIPITOR) 10 MG tablet Take 1 tablet (10 mg total) by mouth daily.     Review of Systems  Constitutional:  Negative for fatigue  and fever.  Respiratory:  Negative for chest tightness and shortness of breath.   Cardiovascular:  Negative for chest pain and palpitations.  Gastrointestinal:  Negative for abdominal pain.  Psychiatric/Behavioral:  Positive for dysphoric mood. The patient is nervous/anxious.     Patient Active Problem List   Diagnosis Date Noted   Aortic atherosclerosis (HCC) 11/07/2022   Coronary artery calcification seen on CT scan 11/07/2022   Emphysema/COPD (HCC) 11/07/2022   Major depressive disorder, recurrent episode, mild (HCC) 09/13/2022   Bipolar disorder with severe depression (HCC) 09/13/2022   Generalized anxiety disorder 09/13/2022   Agoraphobia 09/13/2022   Germaphobia 09/13/2022   Leucocytosis 09/13/2022   Mixed hyperlipidemia 09/13/2022   Hypercalcemia 09/13/2022   Tobacco use disorder 09/12/2022    No Known Allergies  Immunization History  Administered Date(s) Administered   Influenza, Seasonal, Injecte, Preservative Fre 11/07/2022   PNEUMOCOCCAL CONJUGATE-20 10/10/2022   Tdap 10/10/2022    Past Surgical History:  Procedure Laterality Date   TONSILLECTOMY      Social History   Tobacco Use   Smoking status: Every Day    Current packs/day: 1.00    Average packs/day: 1 pack/day for 41.7 years (41.7 ttl pk-yrs)    Types: Cigarettes    Start date: 02/14/1981   Smokeless tobacco: Never  Vaping Use   Vaping status: Never Used  Substance Use Topics   Alcohol use: Yes    Comment: occassionally   Drug use: Never    Family History  Problem Relation Age of Onset  Diabetes Mother    Breast cancer Mother    Diabetes Father    Cancer Father    Bladder Cancer Father    Diabetes Brother         11/07/2022   10:14 AM 10/10/2022    8:27 AM 09/12/2022    1:57 PM  GAD 7 : Generalized Anxiety Score  Nervous, Anxious, on Edge 2 1 3   Control/stop worrying 3 1 3   Worry too much - different things 3 2 3   Trouble relaxing 2 1 3   Restless 2 1 3   Easily annoyed or  irritable 3 2 3   Afraid - awful might happen 2 1 3   Total GAD 7 Score 17 9 21   Anxiety Difficulty Extremely difficult Somewhat difficult Extremely difficult       11/07/2022   10:14 AM 10/10/2022    8:26 AM 09/12/2022    1:56 PM  Depression screen PHQ 2/9  Decreased Interest 3 1 3   Down, Depressed, Hopeless 2 1 3   PHQ - 2 Score 5 2 6   Altered sleeping 0 0 2  Tired, decreased energy 2 0 2  Change in appetite 2 0 3  Feeling bad or failure about yourself  2 1 3   Trouble concentrating 3 1 3   Moving slowly or fidgety/restless 2 1 3   Suicidal thoughts 0 0 1  PHQ-9 Score 16 5 23   Difficult doing work/chores Extremely dIfficult  Extremely dIfficult    BP Readings from Last 3 Encounters:  11/07/22 100/76  10/18/22 125/89  10/10/22 122/68    Wt Readings from Last 3 Encounters:  11/07/22 179 lb (81.2 kg)  10/18/22 181 lb 1.6 oz (82.1 kg)  10/10/22 180 lb (81.6 kg)    BP 100/76   Pulse 85   Temp 97.9 F (36.6 C) (Oral)   Ht 5\' 9"  (1.753 m)   Wt 179 lb (81.2 kg)   SpO2 96%   BMI 26.43 kg/m   Physical Exam Vitals and nursing note reviewed.  Constitutional:      Appearance: Normal appearance.  Cardiovascular:     Rate and Rhythm: Normal rate.  Pulmonary:     Effort: Pulmonary effort is normal.  Abdominal:     General: There is no distension.  Musculoskeletal:        General: Normal range of motion.     Comments: No tenderness to palpation of right forearm or shoulder. AROM preserved though he does have some pain with full extension at the elbow such as when reaching and also with resisted pronation. Strength 5/5  Skin:    General: Skin is warm and dry.  Neurological:     Mental Status: He is alert and oriented to person, place, and time.     Gait: Gait is intact.  Psychiatric:        Mood and Affect: Mood and affect normal.     Recent Labs     Component Value Date/Time   NA 140 10/10/2022 1007   K 4.9 10/10/2022 1007   CL 102 10/10/2022 1007   CO2 26  10/10/2022 1007   GLUCOSE 92 10/10/2022 1007   BUN 16 10/10/2022 1007   CREATININE 0.92 10/10/2022 1007   CALCIUM 9.4 10/10/2022 1007   PROT 7.2 09/12/2022 1449   ALBUMIN 4.5 09/12/2022 1449   AST 21 09/12/2022 1449   ALT 20 09/12/2022 1449   ALKPHOS 114 09/12/2022 1449   BILITOT <0.2 09/12/2022 1449    Lab Results  Component Value Date  WBC 13.2 (H) 10/18/2022   HGB 16.3 10/18/2022   HCT 50.9 10/18/2022   MCV 91.2 10/18/2022   PLT 224 10/18/2022   No results found for: "HGBA1C" Lab Results  Component Value Date   CHOL 210 (H) 09/12/2022   HDL 54 09/12/2022   LDLCALC 133 (H) 09/12/2022   TRIG 128 09/12/2022   CHOLHDL 3.9 09/12/2022   Lab Results  Component Value Date   TSH 1.430 09/12/2022     Assessment and Plan:  1. Mixed hyperlipidemia Refill atorvastatin at increased dose of 20 mg daily, plan to recheck lipids next visit - atorvastatin (LIPITOR) 20 MG tablet; Take 1 tablet (20 mg total) by mouth daily.  Dispense: 90 tablet; Refill: 1  2. Pulmonary emphysema, unspecified emphysema type (HCC) Spent some time today discussing recent LDCT findings.  Strongly encouraged smoking cessation.  Offered nicotine replacement, but deferred at this time.  Patient intends to quit in the near future  3. Aortic atherosclerosis (HCC) Spent some time today discussing recent LDCT findings.  Strongly encouraged smoking cessation.  Offered nicotine replacement, but deferred at this time.  Patient intends to quit in the near future.  Continue atorvastatin  4. Coronary artery calcification seen on CT scan Spent some time today discussing recent LDCT findings.  Strongly encouraged smoking cessation.  Offered nicotine replacement, but deferred at this time.  Patient intends to quit in the near future.  Continue atorvastatin  5. Multiple lung nodules on CT Patient reassured benign appearance.  Plan for repeat LDCT in 1 year  6. Arthritis of right shoulder region Repeat x-ray right  shoulder - DG Shoulder Right  7. Right forearm pain Repeat x-ray right forearm - DG Forearm Right  8. Need for influenza vaccination - Flu vaccine trivalent PF, 6mos and older(Flulaval,Afluria,Fluarix,Fluzone)   No follow-ups on file.    Alvester Morin, PA-C, DMSc, Nutritionist [facility] [phone]

## 2022-11-10 ENCOUNTER — Ambulatory Visit: Payer: Medicaid Other | Admitting: Physician Assistant

## 2022-11-12 LAB — COLOGUARD: COLOGUARD: NEGATIVE

## 2022-11-15 ENCOUNTER — Inpatient Hospital Stay: Payer: Medicaid Other | Attending: Oncology | Admitting: Oncology

## 2022-11-15 ENCOUNTER — Encounter: Payer: Self-pay | Admitting: Oncology

## 2022-11-15 VITALS — BP 109/70 | HR 69 | Temp 97.2°F | Resp 18 | Wt 182.9 lb

## 2022-11-15 DIAGNOSIS — K8020 Calculus of gallbladder without cholecystitis without obstruction: Secondary | ICD-10-CM | POA: Insufficient documentation

## 2022-11-15 DIAGNOSIS — D72829 Elevated white blood cell count, unspecified: Secondary | ICD-10-CM | POA: Diagnosis not present

## 2022-11-15 DIAGNOSIS — Z5986 Financial insecurity: Secondary | ICD-10-CM | POA: Insufficient documentation

## 2022-11-15 DIAGNOSIS — Z803 Family history of malignant neoplasm of breast: Secondary | ICD-10-CM | POA: Diagnosis not present

## 2022-11-15 DIAGNOSIS — F17200 Nicotine dependence, unspecified, uncomplicated: Secondary | ICD-10-CM

## 2022-11-15 DIAGNOSIS — F17210 Nicotine dependence, cigarettes, uncomplicated: Secondary | ICD-10-CM | POA: Diagnosis not present

## 2022-11-15 DIAGNOSIS — Z9089 Acquired absence of other organs: Secondary | ICD-10-CM | POA: Diagnosis not present

## 2022-11-15 DIAGNOSIS — Z79899 Other long term (current) drug therapy: Secondary | ICD-10-CM | POA: Insufficient documentation

## 2022-11-15 DIAGNOSIS — Z8052 Family history of malignant neoplasm of bladder: Secondary | ICD-10-CM | POA: Insufficient documentation

## 2022-11-15 DIAGNOSIS — J439 Emphysema, unspecified: Secondary | ICD-10-CM | POA: Insufficient documentation

## 2022-11-15 DIAGNOSIS — I700 Atherosclerosis of aorta: Secondary | ICD-10-CM | POA: Insufficient documentation

## 2022-11-15 DIAGNOSIS — Z5982 Transportation insecurity: Secondary | ICD-10-CM | POA: Insufficient documentation

## 2022-11-15 DIAGNOSIS — Z833 Family history of diabetes mellitus: Secondary | ICD-10-CM | POA: Diagnosis not present

## 2022-11-15 NOTE — Assessment & Plan Note (Addendum)
Labs reviewed and discussed with patient that Leukocytosis, negative peripheral flowcytometry, negative hepatitis, most likely leukocytosis is reactive due to extensive smoking. A minute population of polyclonal plasmablasts was detected, non specific, could be due to recent vaccination, I will repeat in 6 months

## 2022-11-15 NOTE — Progress Notes (Signed)
Hematology/Oncology Consult Note [phone] [phone]     REFERRING PROVIDER: Remo Lipps, PA   CHIEF COMPLAINTS/REASON FOR VISIT:  leukocytosis  ASSESSMENT & PLAN:   Leucocytosis Labs reviewed and discussed with patient that Leukocytosis, negative peripheral flowcytometry, negative hepatitis, most likely leukocytosis is reactive due to extensive smoking. A minute population of polyclonal plasmablasts was detected, non specific, could be due to recent vaccination, I will repeat in 6 months  Tobacco use disorder Recommend smoke cessation.  He is up to date for CT chest lung cancer screening    Orders Placed This Encounter  Procedures   CBC with Differential (Cancer Center Only)    Standing Status:   Future    Standing Expiration Date:   11/15/2023   Flow cytometry panel-leukemia/lymphoma work-up    Standing Status:   Future    Standing Expiration Date:   11/15/2023   Multiple Myeloma Panel (SPEP&IFE w/QIG)    Standing Status:   Future    Standing Expiration Date:   11/15/2023   Kappa/lambda light chains    Standing Status:   Future    Standing Expiration Date:   11/15/2023    Return of visit: 6 months Cc Remo Lipps, PA All questions were answered. The patient knows to call the clinic with any problems, questions or concerns.  Frank Patience, MD, PhD [facility] Hematology Oncology 11/15/2022    HISTORY OF PRESENTING ILLNESS:  Frank Kelley is a  57 y.o.  male with PMH listed below who was referred to me for evaluation of leukocytosis Reviewed patient' recent labs obtained by PCP.   10/10/22 CBC showed elevated white count of 13.5, predominantly neutrophilia, absolute neutrophils 10.1 Patient did not have remote baseline white count relative compared.  09/12/2022 white count 14.6 with ANC of 10.5. Patient reports that since he has been on disability for shoulder issue, he has lost about 10 to 20 pounds placed over the past 1 year.  He  attributes to being less active, eating less and a muscle loss.  Patient denies ever, chills,night sweats.   Patient has extensive smoking history.  He is scheduled to have a CT chest for lung cancer screening next week. Denies history of recent oral steroid use or steroid injection:  Denies recent infection, denies sinus congestion, cough, urinary frequency/urgency or dysuria, diarrhea, joint swelling/pain or abnormal skin rash, prosthetics. Denies autoimmune disease history.     INTERVAL HISTORY Frank Kelley is a 57 y.o. male who has above history reviewed by me today presents for follow up visit for leukocytosis.    MEDICAL HISTORY:  Past Medical History:  Diagnosis Date   Anxiety    Arthritis    Depression    GERD (gastroesophageal reflux disease)    Ulcer     SURGICAL HISTORY: Past Surgical History:  Procedure Laterality Date   TONSILLECTOMY      SOCIAL HISTORY: Social History   Socioeconomic History   Marital status: Single    Spouse name: Not on file   Number of children: 0   Years of education: Not on file   Highest education level: 12th grade  Occupational History   Not on file  Tobacco Use   Smoking status: Every Day    Current packs/day: 1.00    Average packs/day: 1 pack/day for 41.7 years (41.7 ttl pk-yrs)    Types: Cigarettes    Start date: 02/14/1981   Smokeless tobacco: Never  Vaping Use   Vaping status: Never Used  Substance and  Sexual Activity   Alcohol use: Yes    Comment: occassionally   Drug use: Never   Sexual activity: Not Currently  Other Topics Concern   Not on file  Social History Narrative   Not on file   Social Determinants of Health   Financial Resource Strain: Medium Risk (11/03/2022)   Overall Financial Resource Strain (CARDIA)    Difficulty of Paying Living Expenses: Somewhat hard  Food Insecurity: Food Insecurity Present (11/03/2022)   Hunger Vital Sign    Worried About Running Out of Food in the Last Year: Sometimes true     Ran Out of Food in the Last Year: Sometimes true  Transportation Needs: Unmet Transportation Needs (11/03/2022)   PRAPARE - Transportation    Lack of Transportation (Medical): Yes    Lack of Transportation (Non-Medical): Yes  Physical Activity: Insufficiently Active (11/03/2022)   Exercise Vital Sign    Days of Exercise per Week: 1 day    Minutes of Exercise per Session: 10 min  Stress: Stress Concern Present (11/03/2022)   [employer] of Occupational Health - Occupational Stress Questionnaire    Feeling of Stress : Very much  Social Connections: Socially Isolated (11/03/2022)   Social Connection and Isolation Panel [NHANES]    Frequency of Communication with Friends and Family: Once a week    Frequency of Social Gatherings with Friends and Family: Never    Attends Religious Services: Never    Database administrator or Organizations: No    Attends Engineer, structural: Not on file    Marital Status: Never married  Intimate Partner Violence: Not At Risk (10/18/2022)   Humiliation, Afraid, Rape, and Kick questionnaire    Fear of Current or Ex-Partner: No    Emotionally Abused: No    Physically Abused: No    Sexually Abused: No    FAMILY HISTORY: Family History  Problem Relation Age of Onset   Diabetes Mother    Breast cancer Mother    Diabetes Father    Cancer Father    Bladder Cancer Father    Diabetes Brother     ALLERGIES:  has No Known Allergies.  MEDICATIONS:  Current Outpatient Medications  Medication Sig Dispense Refill   atorvastatin (LIPITOR) 20 MG tablet Take 1 tablet (20 mg total) by mouth daily. 90 tablet 1   hydrOXYzine (VISTARIL) 25 MG capsule Take 1 capsule (25 mg total) by mouth every 8 (eight) hours as needed for anxiety. Do not take with other antihistamines such as cetirizine, diphenhydramine, loratadine, etc 30 capsule 2   No current facility-administered medications for this visit.    Review of Systems  Constitutional:  Negative for  appetite change, chills, fatigue, fever and unexpected weight change.  HENT:   Negative for hearing loss and voice change.   Eyes:  Negative for eye problems and icterus.  Respiratory:  Negative for chest tightness, cough and shortness of breath.   Cardiovascular:  Negative for chest pain and leg swelling.  Gastrointestinal:  Negative for abdominal distention and abdominal pain.  Endocrine: Negative for hot flashes.  Genitourinary:  Negative for difficulty urinating, dysuria and frequency.   Musculoskeletal:  Positive for arthralgias.  Skin:  Negative for itching and rash.  Neurological:  Negative for light-headedness and numbness.  Hematological:  Negative for adenopathy. Does not bruise/bleed easily.  Psychiatric/Behavioral:  Negative for confusion.     PHYSICAL EXAMINATION: ECOG PERFORMANCE STATUS: 1 - Symptomatic but completely ambulatory Vitals:   11/15/22 1035  BP: 109/70  Pulse: 69  Resp: 18  Temp: (!) 97.2 F (36.2 C)  SpO2: 100%   Filed Weights   11/15/22 1035  Weight: 182 lb 14.4 oz (83 kg)    Physical Exam Constitutional:      General: He is not in acute distress. HENT:     Head: Normocephalic and atraumatic.  Eyes:     General: No scleral icterus. Cardiovascular:     Rate and Rhythm: Normal rate and regular rhythm.     Heart sounds: Normal heart sounds.  Pulmonary:     Effort: Pulmonary effort is normal. No respiratory distress.     Breath sounds: No wheezing.  Abdominal:     General: Bowel sounds are normal. There is no distension.     Palpations: Abdomen is soft.  Musculoskeletal:        General: No deformity. Normal range of motion.     Cervical back: Normal range of motion and neck supple.  Skin:    General: Skin is warm and dry.     Findings: No erythema or rash.  Neurological:     Mental Status: He is alert and oriented to person, place, and time. Mental status is at baseline.     Cranial Nerves: No cranial nerve deficit.     Coordination:  Coordination normal.  Psychiatric:        Mood and Affect: Mood normal.        RADIOGRAPHIC STUDIES: I have personally reviewed the radiological images as listed and agreed with the findings in the report. CT CHEST LUNG CA SCREEN LOW DOSE W/O CM  Result Date: 11/03/2022 CLINICAL DATA:  Current smoker with 41 pack-year history EXAM: CT CHEST WITHOUT CONTRAST LOW-DOSE FOR LUNG CANCER SCREENING TECHNIQUE: Multidetector CT imaging of the chest was performed following the standard protocol without IV contrast. RADIATION DOSE REDUCTION: This exam was performed according to the departmental dose-optimization program which includes automated exposure control, adjustment of the mA and/or kV according to patient size and/or use of iterative reconstruction technique. COMPARISON:  None Available. FINDINGS: Cardiovascular: Normal heart size. No pericardial effusion. Mild coronary artery calcifications. Normal caliber thoracic aorta with mild atherosclerotic disease. Mediastinum/Nodes: Esophagus and thyroid are unremarkable. No enlarged lymph nodes seen in the chest. Lungs/Pleura: Central airways are patent. No consolidation, pleural effusion or pneumothorax. Mild paraseptal emphysema. Small solid pulmonary nodules. Reference subpleural solid nodule of the right lower lobe measuring 3.7 mm on image 227. Upper Abdomen: Gallstones.  No acute abnormality. Musculoskeletal: No chest wall mass or suspicious bone lesions identified. IMPRESSION: 1. Lung-RADS 2S, benign appearance or behavior. Continue annual screening with low-dose chest CT without contrast in 12 months. S modifier for coronary artery calcifications. 2. Mild coronary artery calcifications, recommend ASCVD risk assessment. 3. Aortic Atherosclerosis (ICD10-I70.0) and Emphysema (ICD10-J43.9). Electronically Signed   By: Allegra Lai M.D.   On: 11/03/2022 18:53    LABORATORY DATA:  I have reviewed the data as listed    Latest Ref Rng & Units 10/18/2022    10:18 AM 10/10/2022   10:07 AM 09/12/2022    2:49 PM  CBC  WBC 4.0 - 10.5 K/uL 13.2  13.5  14.6   Hemoglobin 13.0 - 17.0 g/dL 16.1  09.6  04.5   Hematocrit 39.0 - 52.0 % 50.9  48.9  49.9   Platelets 150 - 400 K/uL 224  229  256       Latest Ref Rng & Units 10/10/2022   10:07 AM 09/12/2022    2:49 PM  CMP  Glucose 70 - 99 mg/dL 92  93   BUN 6 - 24 mg/dL 16  18   Creatinine 1.61 - 1.27 mg/dL 0.96  0.45   Sodium 409 - 144 mmol/L 140  140   Potassium 3.5 - 5.2 mmol/L 4.9  4.6   Chloride 96 - 106 mmol/L 102  100   CO2 20 - 29 mmol/L 26  26   Calcium 8.7 - 10.2 mg/dL 9.4  81.1   Total Protein 6.0 - 8.5 g/dL  7.2   Total Bilirubin 0.0 - 1.2 mg/dL  <9.1   Alkaline Phos 44 - 121 IU/L  114   AST 0 - 40 IU/L  21   ALT 0 - 44 IU/L  20

## 2022-11-15 NOTE — Assessment & Plan Note (Signed)
Recommend smoke cessation.  He is up to date for CT chest lung cancer screening

## 2022-11-25 ENCOUNTER — Encounter: Payer: Self-pay | Admitting: Physician Assistant

## 2022-11-25 ENCOUNTER — Ambulatory Visit: Payer: Self-pay | Admitting: Psychiatry

## 2022-11-25 DIAGNOSIS — M19011 Primary osteoarthritis, right shoulder: Secondary | ICD-10-CM | POA: Insufficient documentation

## 2022-12-05 DIAGNOSIS — H18413 Arcus senilis, bilateral: Secondary | ICD-10-CM | POA: Diagnosis not present

## 2022-12-05 DIAGNOSIS — H11443 Conjunctival cysts, bilateral: Secondary | ICD-10-CM | POA: Diagnosis not present

## 2022-12-05 DIAGNOSIS — H2513 Age-related nuclear cataract, bilateral: Secondary | ICD-10-CM | POA: Diagnosis not present

## 2022-12-05 DIAGNOSIS — H16229 Keratoconjunctivitis sicca, not specified as Sjogren's, unspecified eye: Secondary | ICD-10-CM | POA: Diagnosis not present

## 2023-01-09 ENCOUNTER — Ambulatory Visit: Payer: Medicaid Other | Admitting: Physician Assistant

## 2023-01-09 ENCOUNTER — Encounter: Payer: Self-pay | Admitting: Physician Assistant

## 2023-01-09 VITALS — BP 124/78 | HR 62 | Temp 97.8°F | Ht 69.0 in | Wt 185.0 lb

## 2023-01-09 DIAGNOSIS — E782 Mixed hyperlipidemia: Secondary | ICD-10-CM

## 2023-01-09 DIAGNOSIS — F17200 Nicotine dependence, unspecified, uncomplicated: Secondary | ICD-10-CM | POA: Diagnosis not present

## 2023-01-09 DIAGNOSIS — H5203 Hypermetropia, bilateral: Secondary | ICD-10-CM | POA: Diagnosis not present

## 2023-01-09 DIAGNOSIS — H52223 Regular astigmatism, bilateral: Secondary | ICD-10-CM | POA: Diagnosis not present

## 2023-01-09 DIAGNOSIS — K0889 Other specified disorders of teeth and supporting structures: Secondary | ICD-10-CM

## 2023-01-09 DIAGNOSIS — H04123 Dry eye syndrome of bilateral lacrimal glands: Secondary | ICD-10-CM | POA: Diagnosis not present

## 2023-01-09 MED ORDER — NICOTINE 21 MG/24HR TD PT24
21.0000 mg | MEDICATED_PATCH | Freq: Every day | TRANSDERMAL | 1 refills | Status: DC
Start: 2023-01-09 — End: 2023-11-16

## 2023-01-09 MED ORDER — IBUPROFEN 800 MG PO TABS
800.0000 mg | ORAL_TABLET | Freq: Two times a day (BID) | ORAL | 0 refills | Status: DC | PRN
Start: 2023-01-09 — End: 2023-03-21

## 2023-01-09 NOTE — Progress Notes (Signed)
Date:  01/09/2023   Name:  Frank Kelley   DOB:  01-02-1966   MRN:  [id number]   Chief Complaint: Chronic Conditions  and Dental Pain (Had top teeth pulled 2 weeks ago, feels like his jaw bone may be exposed.)  HPI Frank Kelley returns for 51-month follow-up on chronic conditions namely HLD, tobacco use disorder, emphysema.  Evaluated for leukocytosis with hematology 11/15/2022, suspected to be reactive leukocytosis with smoking.  He has still not been able to quit, but is interested in doing so.  Asks for nicotine replacement therapy today.  2 weeks ago had all of his top teeth removed by dentist, had a postop with them 6 days ago with good report, still experiencing some dental pain and would like me to have a look.  Will also be seeing them tomorrow for removal of the bottom teeth.  Ultimately planning for upper and lower dentures.  Interestingly, he was given ibuprofen 800 mg for the dental pain, which he reports actually significantly relieved his anxiety and insomnia.  He would like a refill on this medication today.   Medication list has been reviewed and updated.  Current Meds  Medication Sig   atorvastatin (LIPITOR) 20 MG tablet Take 1 tablet (20 mg total) by mouth daily.   hydrOXYzine (VISTARIL) 25 MG capsule Take 1 capsule (25 mg total) by mouth every 8 (eight) hours as needed for anxiety. Do not take with other antihistamines such as cetirizine, diphenhydramine, loratadine, etc   ibuprofen (ADVIL) 800 MG tablet Take 1 tablet (800 mg total) by mouth 2 (two) times daily as needed.   nicotine (NICODERM CQ - DOSED IN MG/24 HOURS) 21 mg/24hr patch Place 1 patch (21 mg total) onto the skin daily. Apply patch to clean, dry skin in the morning and remove each night before bed. Stop cigarette use completely while on the patch.     Review of Systems  Patient Active Problem List   Diagnosis Date Noted   Degenerative arthritis of right shoulder region 11/25/2022   Aortic  atherosclerosis (HCC) 11/07/2022   Coronary artery calcification seen on CT scan 11/07/2022   Emphysema/COPD (HCC) 11/07/2022   Multiple lung nodules on CT 11/07/2022   Major depressive disorder, recurrent episode, mild (HCC) 09/13/2022   Bipolar disorder with severe depression (HCC) 09/13/2022   Generalized anxiety disorder 09/13/2022   Agoraphobia 09/13/2022   Germaphobia 09/13/2022   Leucocytosis 09/13/2022   Mixed hyperlipidemia 09/13/2022   Hypercalcemia 09/13/2022   Tobacco use disorder 09/12/2022    No Known Allergies  Immunization History  Administered Date(s) Administered   Influenza, Seasonal, Injecte, Preservative Fre 11/07/2022   PNEUMOCOCCAL CONJUGATE-20 10/10/2022   Tdap 10/10/2022    Past Surgical History:  Procedure Laterality Date   TONSILLECTOMY      Social History   Tobacco Use   Smoking status: Every Day    Current packs/day: 1.00    Average packs/day: 1 pack/day for 41.9 years (41.9 ttl pk-yrs)    Types: Cigarettes    Start date: 02/14/1981   Smokeless tobacco: Never  Vaping Use   Vaping status: Never Used  Substance Use Topics   Alcohol use: Yes    Comment: occassionally   Drug use: Never    Family History  Problem Relation Age of Onset   Diabetes Mother    Breast cancer Mother    Diabetes Father    Cancer Father    Bladder Cancer Father    Diabetes Brother  01/09/2023   10:14 AM 11/07/2022   10:14 AM 10/10/2022    8:27 AM 09/12/2022    1:57 PM  GAD 7 : Generalized Anxiety Score  Nervous, Anxious, on Edge 3 2 1 3   Control/stop worrying 3 3 1 3   Worry too much - different things 3 3 2 3   Trouble relaxing 3 2 1 3   Restless 3 2 1 3   Easily annoyed or irritable 3 3 2 3   Afraid - awful might happen 3 2 1 3   Total GAD 7 Score 21 17 9 21   Anxiety Difficulty Extremely difficult Extremely difficult Somewhat difficult Extremely difficult       01/09/2023   10:13 AM 11/07/2022   10:14 AM 10/10/2022    8:26 AM  Depression screen  PHQ 2/9  Decreased Interest 2 3 1   Down, Depressed, Hopeless 1 2 1   PHQ - 2 Score 3 5 2   Altered sleeping 0 0 0  Tired, decreased energy 2 2 0  Change in appetite 2 2 0  Feeling bad or failure about yourself  2 2 1   Trouble concentrating 3 3 1   Moving slowly or fidgety/restless 3 2 1   Suicidal thoughts 0 0 0  PHQ-9 Score 15 16 5   Difficult doing work/chores Somewhat difficult Extremely dIfficult     BP Readings from Last 3 Encounters:  01/09/23 124/78  11/15/22 109/70  11/07/22 100/76    Wt Readings from Last 3 Encounters:  01/09/23 185 lb (83.9 kg)  11/15/22 182 lb 14.4 oz (83 kg)  11/07/22 179 lb (81.2 kg)    BP 124/78 (BP Location: Left Arm, Patient Position: Sitting, Cuff Size: Large)   Pulse 62   Temp 97.8 F (36.6 C) (Oral)   Ht 5\' 9"  (1.753 m)   Wt 185 lb (83.9 kg)   SpO2 98%   BMI 27.32 kg/m   Physical Exam Vitals and nursing note reviewed.  Constitutional:      Appearance: Normal appearance.  HENT:     Mouth/Throat:     Comments: All upper teeth have been surgically removed.  Some maxillary bony prominence noted with mild localized tenderness at the left upper anterior gums and left maxillary sinus, but overall surgical site appears to be healing well with no suspicion for dental abscess or other infection. Cardiovascular:     Rate and Rhythm: Normal rate.  Pulmonary:     Effort: Pulmonary effort is normal.  Abdominal:     General: There is no distension.  Musculoskeletal:        General: Normal range of motion.  Skin:    General: Skin is warm and dry.  Neurological:     Mental Status: He is alert and oriented to person, place, and time.     Gait: Gait is intact.  Psychiatric:        Mood and Affect: Mood and affect normal.     Recent Labs     Component Value Date/Time   NA 140 10/10/2022 1007   K 4.9 10/10/2022 1007   CL 102 10/10/2022 1007   CO2 26 10/10/2022 1007   GLUCOSE 92 10/10/2022 1007   BUN 16 10/10/2022 1007   CREATININE 0.92  10/10/2022 1007   CALCIUM 9.4 10/10/2022 1007   PROT 7.2 09/12/2022 1449   ALBUMIN 4.5 09/12/2022 1449   AST 21 09/12/2022 1449   ALT 20 09/12/2022 1449   ALKPHOS 114 09/12/2022 1449   BILITOT <0.2 09/12/2022 1449    Lab Results  Component  Value Date   WBC 13.2 (H) 10/18/2022   HGB 16.3 10/18/2022   HCT 50.9 10/18/2022   MCV 91.2 10/18/2022   PLT 224 10/18/2022   No results found for: "HGBA1C" Lab Results  Component Value Date   CHOL 210 (H) 09/12/2022   HDL 54 09/12/2022   LDLCALC 133 (H) 09/12/2022   TRIG 128 09/12/2022   CHOLHDL 3.9 09/12/2022   Lab Results  Component Value Date   TSH 1.430 09/12/2022     Assessment and Plan:  1. Pain, dental Patient reassured gums appear to be in appropriate stage of healing.  Will refill ibuprofen as below, not only for the dental pain but also for his right shoulder pain.  He is to take it with food, and to avoid using more than necessary.  Plan to do this for 1-2 months but discussed with the patient ibuprofen is not typically used long-term.  Advised to let his dental team know that he is using ibuprofen.  - ibuprofen (ADVIL) 800 MG tablet; Take 1 tablet (800 mg total) by mouth 2 (two) times daily as needed.  Dispense: 60 tablet; Refill: 0  2. Tobacco use disorder Reviewed the negative impact of tobacco products on dental health including delayed healing.  Patient requests nicotine lozenges, which would certainly be better than cigarettes, though still not ideal with his dental state.  Plan to use nicotine patches instead for the next 1 to 2 months, instructions given.  Also given smoking cessation hotline 1 800 QUIT NOW for additional resources. - nicotine (NICODERM CQ - DOSED IN MG/24 HOURS) 21 mg/24hr patch; Place 1 patch (21 mg total) onto the skin daily. Apply patch to clean, dry skin in the morning and remove each night before bed. Stop cigarette use completely while on the patch.  Dispense: 42 patch; Refill: 1  3. Mixed  hyperlipidemia Patient requests to defer labs today due to recent weight gain and suboptimal nutrition recently due to dental procedures.  We will plan to do this next time.   Return in about 2 months (around 03/11/2023) for OV f/u chronic conditions with labs.    Alvester Morin, PA-C, DMSc, Nutritionist [facility] [phone]

## 2023-01-10 DIAGNOSIS — H5213 Myopia, bilateral: Secondary | ICD-10-CM | POA: Diagnosis not present

## 2023-02-27 ENCOUNTER — Ambulatory Visit: Payer: Medicaid Other | Admitting: Internal Medicine

## 2023-03-13 ENCOUNTER — Ambulatory Visit: Payer: Medicaid Other | Admitting: Physician Assistant

## 2023-03-20 ENCOUNTER — Encounter: Payer: Self-pay | Admitting: Physician Assistant

## 2023-03-20 ENCOUNTER — Ambulatory Visit (INDEPENDENT_AMBULATORY_CARE_PROVIDER_SITE_OTHER): Payer: Medicaid Other | Admitting: Physician Assistant

## 2023-03-20 VITALS — BP 110/80 | HR 67 | Temp 97.7°F | Ht 69.0 in | Wt 186.0 lb

## 2023-03-20 DIAGNOSIS — F17200 Nicotine dependence, unspecified, uncomplicated: Secondary | ICD-10-CM | POA: Diagnosis not present

## 2023-03-20 DIAGNOSIS — E782 Mixed hyperlipidemia: Secondary | ICD-10-CM | POA: Diagnosis not present

## 2023-03-20 DIAGNOSIS — D72829 Elevated white blood cell count, unspecified: Secondary | ICD-10-CM

## 2023-03-20 DIAGNOSIS — F330 Major depressive disorder, recurrent, mild: Secondary | ICD-10-CM | POA: Diagnosis not present

## 2023-03-20 MED ORDER — DULOXETINE HCL 30 MG PO CPEP
30.0000 mg | ORAL_CAPSULE | Freq: Every day | ORAL | 1 refills | Status: DC
Start: 2023-03-20 — End: 2023-07-13

## 2023-03-20 NOTE — Progress Notes (Signed)
Date:  03/20/2023   Name:  Frank Kelley   DOB:  04-May-1965   MRN:  [id number]   Chief Complaint: Medical Management of Chronic Issues  HPI Frank Kelley returns for follow-up on chronic conditions.  Due for repeat labs  Unfortunately has not quit smoking, has not tried the patches prescribed last visit.  Did not feel like he was ready.  His new teeth have not yet arrived through dentistry.  I refilled his ibuprofen 800 mg last visit, states he is not using it every day, maybe a few times per week.  Continues to struggle with anxiety and depression, did not tolerate Wellbutrin last summer which I prescribed in hopes that it would help with smoking cessation.  Has not tried any other antidepressants.    Medication list has been reviewed and updated.  Current Meds  Medication Sig   atorvastatin (LIPITOR) 20 MG tablet Take 1 tablet (20 mg total) by mouth daily.   DULoxetine (CYMBALTA) 30 MG capsule Take 1 capsule (30 mg total) by mouth daily.   hydrOXYzine (VISTARIL) 25 MG capsule Take 1 capsule (25 mg total) by mouth every 8 (eight) hours as needed for anxiety. Do not take with other antihistamines such as cetirizine, diphenhydramine, loratadine, etc   ibuprofen (ADVIL) 800 MG tablet Take 1 tablet (800 mg total) by mouth 2 (two) times daily as needed.     Review of Systems  Patient Active Problem List   Diagnosis Date Noted   Degenerative arthritis of right shoulder region 11/25/2022   Aortic atherosclerosis (HCC) 11/07/2022   Coronary artery calcification seen on CT scan 11/07/2022   Emphysema/COPD (HCC) 11/07/2022   Multiple lung nodules on CT 11/07/2022   Major depressive disorder, recurrent episode, mild (HCC) 09/13/2022   Bipolar disorder with severe depression (HCC) 09/13/2022   Generalized anxiety disorder 09/13/2022   Agoraphobia 09/13/2022   Germaphobia 09/13/2022   Leucocytosis 09/13/2022   Mixed hyperlipidemia 09/13/2022   Hypercalcemia 09/13/2022   Tobacco use  disorder 09/12/2022    No Known Allergies  Immunization History  Administered Date(s) Administered   Influenza, Seasonal, Injecte, Preservative Fre 11/07/2022   PNEUMOCOCCAL CONJUGATE-20 10/10/2022   Tdap 10/10/2022    Past Surgical History:  Procedure Laterality Date   TONSILLECTOMY      Social History   Tobacco Use   Smoking status: Every Day    Current packs/day: 1.00    Average packs/day: 1 pack/day for 42.1 years (42.1 ttl pk-yrs)    Types: Cigarettes    Start date: 02/14/1981   Smokeless tobacco: Never  Vaping Use   Vaping status: Never Used  Substance Use Topics   Alcohol use: Yes    Comment: occassionally   Drug use: Never    Family History  Problem Relation Age of Onset   Diabetes Mother    Breast cancer Mother    Diabetes Father    Cancer Father    Bladder Cancer Father    Diabetes Brother         03/20/2023    9:33 AM 01/09/2023   10:14 AM 11/07/2022   10:14 AM 10/10/2022    8:27 AM  GAD 7 : Generalized Anxiety Score  Nervous, Anxious, on Edge 3 3 2 1   Control/stop worrying 3 3 3 1   Worry too much - different things 3 3 3 2   Trouble relaxing 3 3 2 1   Restless 0 3 2 1   Easily annoyed or irritable 3 3 3 2   Afraid - awful might  happen 3 3 2 1   Total GAD 7 Score 18 21 17 9   Anxiety Difficulty Somewhat difficult Extremely difficult Extremely difficult Somewhat difficult       03/20/2023    9:33 AM 01/09/2023   10:13 AM 11/07/2022   10:14 AM  Depression screen PHQ 2/9  Decreased Interest 3 2 3   Down, Depressed, Hopeless 3 1 2   PHQ - 2 Score 6 3 5   Altered sleeping 0 0 0  Tired, decreased energy 2 2 2   Change in appetite 1 2 2   Feeling bad or failure about yourself  1 2 2   Trouble concentrating 3 3 3   Moving slowly or fidgety/restless 2 3 2   Suicidal thoughts 0 0 0  PHQ-9 Score 15 15 16   Difficult doing work/chores Somewhat difficult Somewhat difficult Extremely dIfficult    BP Readings from Last 3 Encounters:  03/20/23 110/80  01/09/23  124/78  11/15/22 109/70    Wt Readings from Last 3 Encounters:  03/20/23 186 lb (84.4 kg)  01/09/23 185 lb (83.9 kg)  11/15/22 182 lb 14.4 oz (83 kg)    BP 110/80   Pulse 67   Temp 97.7 F (36.5 C)   Ht 5\' 9"  (1.753 m)   Wt 186 lb (84.4 kg)   SpO2 100%   BMI 27.47 kg/m   Physical Exam Vitals and nursing note reviewed.  Constitutional:      Appearance: Normal appearance.  Cardiovascular:     Rate and Rhythm: Normal rate.  Pulmonary:     Effort: Pulmonary effort is normal.  Abdominal:     General: There is no distension.  Musculoskeletal:        General: Normal range of motion.  Skin:    General: Skin is warm and dry.  Neurological:     Mental Status: He is alert and oriented to person, place, and time.     Gait: Gait is intact.  Psychiatric:        Mood and Affect: Mood and affect normal.     Recent Labs     Component Value Date/Time   NA 140 10/10/2022 1007   K 4.9 10/10/2022 1007   CL 102 10/10/2022 1007   CO2 26 10/10/2022 1007   GLUCOSE 92 10/10/2022 1007   BUN 16 10/10/2022 1007   CREATININE 0.92 10/10/2022 1007   CALCIUM 9.4 10/10/2022 1007   PROT 7.2 09/12/2022 1449   ALBUMIN 4.5 09/12/2022 1449   AST 21 09/12/2022 1449   ALT 20 09/12/2022 1449   ALKPHOS 114 09/12/2022 1449   BILITOT <0.2 09/12/2022 1449    Lab Results  Component Value Date   WBC 13.2 (H) 10/18/2022   HGB 16.3 10/18/2022   HCT 50.9 10/18/2022   MCV 91.2 10/18/2022   PLT 224 10/18/2022   No results found for: "HGBA1C" Lab Results  Component Value Date   CHOL 210 (H) 09/12/2022   HDL 54 09/12/2022   LDLCALC 133 (H) 09/12/2022   TRIG 128 09/12/2022   CHOLHDL 3.9 09/12/2022   Lab Results  Component Value Date   TSH 1.430 09/12/2022     Assessment and Plan:  1. Tobacco use disorder (Primary) Encouraged cessation.  Reminded that continued smoking will delay dental healing.  Hematology also suspects this is the cause of his leukocytosis. - CBC with  Differential/Platelet - Comprehensive metabolic panel  2. Major depressive disorder, recurrent episode, mild (HCC) Will try low-dose duloxetine with follow-up in a month - DULoxetine (CYMBALTA) 30 MG capsule; Take  1 capsule (30 mg total) by mouth daily.  Dispense: 30 capsule; Refill: 1  3. Mixed hyperlipidemia Check lipids today, nearly fasting (had some coffee with sugar) - Comprehensive metabolic panel - Lipid panel  4. Leukocytosis, unspecified type Repeat CBC - CBC with Differential/Platelet    Return in about 4 weeks (around 04/17/2023) for OV f/u duloxetine.    Alvester Morin, PA-C, DMSc, Nutritionist [facility] [phone]

## 2023-03-21 ENCOUNTER — Other Ambulatory Visit: Payer: Self-pay | Admitting: Physician Assistant

## 2023-03-21 DIAGNOSIS — K0889 Other specified disorders of teeth and supporting structures: Secondary | ICD-10-CM

## 2023-03-21 LAB — LIPID PANEL
Chol/HDL Ratio: 2.6 {ratio} (ref 0.0–5.0)
Cholesterol, Total: 140 mg/dL (ref 100–199)
HDL: 53 mg/dL (ref >39)
LDL Chol Calc (NIH): 72 mg/dL (ref 0–99)
Triglycerides: 77 mg/dL (ref 0–149)
VLDL Cholesterol Cal: 15 mg/dL (ref 5–40)

## 2023-03-21 LAB — CBC WITH DIFFERENTIAL/PLATELET
Basophils Absolute: 0.1 10*3/uL (ref 0.0–0.2)
Basos: 0 %
EOS (ABSOLUTE): 0.2 10*3/uL (ref 0.0–0.4)
Eos: 2 %
Hematocrit: 46.5 % (ref 37.5–51.0)
Hemoglobin: 15.9 g/dL (ref 13.0–17.7)
Immature Grans (Abs): 0 10*3/uL (ref 0.0–0.1)
Immature Granulocytes: 0 %
Lymphocytes Absolute: 2.8 10*3/uL (ref 0.7–3.1)
Lymphs: 21 %
MCH: 30.5 pg (ref 26.6–33.0)
MCHC: 34.2 g/dL (ref 31.5–35.7)
MCV: 89 fL (ref 79–97)
Monocytes Absolute: 0.7 10*3/uL (ref 0.1–0.9)
Monocytes: 5 %
Neutrophils Absolute: 9.5 10*3/uL — ABNORMAL HIGH (ref 1.4–7.0)
Neutrophils: 72 %
Platelets: 242 10*3/uL (ref 150–450)
RBC: 5.22 x10E6/uL (ref 4.14–5.80)
RDW: 12.1 % (ref 11.6–15.4)
WBC: 13.2 10*3/uL — ABNORMAL HIGH (ref 3.4–10.8)

## 2023-03-21 LAB — COMPREHENSIVE METABOLIC PANEL
ALT: 27 [IU]/L (ref 0–44)
AST: 29 [IU]/L (ref 0–40)
Albumin: 4.2 g/dL (ref 3.8–4.9)
Alkaline Phosphatase: 107 [IU]/L (ref 44–121)
BUN/Creatinine Ratio: 15 (ref 9–20)
BUN: 14 mg/dL (ref 6–24)
Bilirubin Total: 0.3 mg/dL (ref 0.0–1.2)
CO2: 25 mmol/L (ref 20–29)
Calcium: 9.7 mg/dL (ref 8.7–10.2)
Chloride: 102 mmol/L (ref 96–106)
Creatinine, Ser: 0.91 mg/dL (ref 0.76–1.27)
Globulin, Total: 2.4 g/dL (ref 1.5–4.5)
Glucose: 85 mg/dL (ref 70–99)
Potassium: 4.8 mmol/L (ref 3.5–5.2)
Sodium: 143 mmol/L (ref 134–144)
Total Protein: 6.6 g/dL (ref 6.0–8.5)
eGFR: 98 mL/min/{1.73_m2} (ref >59)

## 2023-03-21 MED ORDER — IBUPROFEN 800 MG PO TABS: 800.0000 mg | ORAL_TABLET | Freq: Two times a day (BID) | ORAL | 1 refills | Status: DC | PRN

## 2023-03-21 NOTE — Telephone Encounter (Signed)
 Please review.  KP

## 2023-04-17 ENCOUNTER — Ambulatory Visit: Payer: Medicaid Other | Admitting: Physician Assistant

## 2023-05-01 ENCOUNTER — Other Ambulatory Visit: Payer: Self-pay | Admitting: Physician Assistant

## 2023-05-01 DIAGNOSIS — E782 Mixed hyperlipidemia: Secondary | ICD-10-CM

## 2023-05-02 NOTE — Telephone Encounter (Signed)
 Requested Prescriptions  Pending Prescriptions Disp Refills   atorvastatin (LIPITOR) 20 MG tablet [Pharmacy Med Name: ATORVASTATIN 20MG  TABLETS] 90 tablet 3    Sig: TAKE 1 TABLET(20 MG) BY MOUTH DAILY     Cardiovascular:  Antilipid - Statins Failed - 05/02/2023  4:02 PM      Failed - Lipid Panel in normal range within the last 12 months    Cholesterol, Total  Date Value Ref Range Status  03/20/2023 140 100 - 199 mg/dL Final   LDL Chol Calc (NIH)  Date Value Ref Range Status  03/20/2023 72 0 - 99 mg/dL Final   HDL  Date Value Ref Range Status  03/20/2023 53 >39 mg/dL Final   Triglycerides  Date Value Ref Range Status  03/20/2023 77 0 - 149 mg/dL Final         Passed - Patient is not pregnant      Passed - Valid encounter within last 12 months    Recent Outpatient Visits           3 months ago Pain, dental   [place] [facility], [place], [place]   5 months ago Mixed hyperlipidemia   [facility] at [facility], [place] Al[place], [place]   6 months ago Major depressive disorder, recurrent episode, mild (HCC)   [facility] at [facility], [place] Alar, [place]   7 months ago Annual physical exam   [facility] at [facility], Melton Alar, [place]

## 2023-05-16 ENCOUNTER — Inpatient Hospital Stay: Payer: Medicaid Other | Attending: Oncology

## 2023-05-16 DIAGNOSIS — Z79899 Other long term (current) drug therapy: Secondary | ICD-10-CM | POA: Insufficient documentation

## 2023-05-16 DIAGNOSIS — D72829 Elevated white blood cell count, unspecified: Secondary | ICD-10-CM | POA: Diagnosis not present

## 2023-05-16 DIAGNOSIS — Z5941 Food insecurity: Secondary | ICD-10-CM | POA: Diagnosis not present

## 2023-05-16 DIAGNOSIS — Z833 Family history of diabetes mellitus: Secondary | ICD-10-CM | POA: Insufficient documentation

## 2023-05-16 DIAGNOSIS — Z9089 Acquired absence of other organs: Secondary | ICD-10-CM | POA: Diagnosis not present

## 2023-05-16 DIAGNOSIS — Z5986 Financial insecurity: Secondary | ICD-10-CM | POA: Diagnosis not present

## 2023-05-16 DIAGNOSIS — R768 Other specified abnormal immunological findings in serum: Secondary | ICD-10-CM | POA: Insufficient documentation

## 2023-05-16 DIAGNOSIS — F17210 Nicotine dependence, cigarettes, uncomplicated: Secondary | ICD-10-CM | POA: Diagnosis not present

## 2023-05-16 DIAGNOSIS — Z8052 Family history of malignant neoplasm of bladder: Secondary | ICD-10-CM | POA: Diagnosis not present

## 2023-05-16 DIAGNOSIS — F32A Depression, unspecified: Secondary | ICD-10-CM | POA: Diagnosis not present

## 2023-05-16 DIAGNOSIS — F419 Anxiety disorder, unspecified: Secondary | ICD-10-CM | POA: Insufficient documentation

## 2023-05-16 DIAGNOSIS — Z809 Family history of malignant neoplasm, unspecified: Secondary | ICD-10-CM | POA: Diagnosis not present

## 2023-05-16 DIAGNOSIS — Z803 Family history of malignant neoplasm of breast: Secondary | ICD-10-CM | POA: Diagnosis not present

## 2023-05-16 DIAGNOSIS — Z5982 Transportation insecurity: Secondary | ICD-10-CM | POA: Diagnosis not present

## 2023-05-16 LAB — CBC WITH DIFFERENTIAL (CANCER CENTER ONLY)
Abs Immature Granulocytes: 0.23 10*3/uL — ABNORMAL HIGH (ref 0.00–0.07)
Basophils Absolute: 0.1 10*3/uL (ref 0.0–0.1)
Basophils Relative: 1 %
Eosinophils Absolute: 0.2 10*3/uL (ref 0.0–0.5)
Eosinophils Relative: 1 %
HCT: 48.2 % (ref 39.0–52.0)
Hemoglobin: 15.6 g/dL (ref 13.0–17.0)
Immature Granulocytes: 2 %
Lymphocytes Relative: 20 %
Lymphs Abs: 2.5 10*3/uL (ref 0.7–4.0)
MCH: 29.7 pg (ref 26.0–34.0)
MCHC: 32.4 g/dL (ref 30.0–36.0)
MCV: 91.6 fL (ref 80.0–100.0)
Monocytes Absolute: 0.8 10*3/uL (ref 0.1–1.0)
Monocytes Relative: 6 %
Neutro Abs: 8.8 10*3/uL — ABNORMAL HIGH (ref 1.7–7.7)
Neutrophils Relative %: 70 %
Platelet Count: 235 10*3/uL (ref 150–400)
RBC: 5.26 MIL/uL (ref 4.22–5.81)
RDW: 13.7 % (ref 11.5–15.5)
WBC Count: 12.5 10*3/uL — ABNORMAL HIGH (ref 4.0–10.5)
nRBC: 0 % (ref 0.0–0.2)

## 2023-05-17 LAB — KAPPA/LAMBDA LIGHT CHAINS
Kappa free light chain: 22.6 mg/L — ABNORMAL HIGH (ref 3.3–19.4)
Kappa, lambda light chain ratio: 1.11 (ref 0.26–1.65)
Lambda free light chains: 20.4 mg/L (ref 5.7–26.3)

## 2023-05-18 LAB — COMP PANEL: LEUKEMIA/LYMPHOMA

## 2023-05-19 LAB — MULTIPLE MYELOMA PANEL, SERUM
Albumin SerPl Elph-Mcnc: 3.7 g/dL (ref 2.9–4.4)
Albumin/Glob SerPl: 1.3 (ref 0.7–1.7)
Alpha 1: 0.2 g/dL (ref 0.0–0.4)
Alpha2 Glob SerPl Elph-Mcnc: 0.8 g/dL (ref 0.4–1.0)
B-Globulin SerPl Elph-Mcnc: 1 g/dL (ref 0.7–1.3)
Gamma Glob SerPl Elph-Mcnc: 1 g/dL (ref 0.4–1.8)
Globulin, Total: 3 g/dL (ref 2.2–3.9)
IgA: 169 mg/dL (ref 90–386)
IgG (Immunoglobin G), Serum: 955 mg/dL (ref 603–1613)
IgM (Immunoglobulin M), Srm: 293 mg/dL — ABNORMAL HIGH (ref 20–172)
Total Protein ELP: 6.7 g/dL (ref 6.0–8.5)

## 2023-05-23 ENCOUNTER — Inpatient Hospital Stay (HOSPITAL_BASED_OUTPATIENT_CLINIC_OR_DEPARTMENT_OTHER): Payer: Medicaid Other | Admitting: Oncology

## 2023-05-23 ENCOUNTER — Encounter: Payer: Self-pay | Admitting: Oncology

## 2023-05-23 VITALS — BP 122/75 | HR 74 | Temp 97.0°F | Resp 18 | Wt 192.8 lb

## 2023-05-23 DIAGNOSIS — Z833 Family history of diabetes mellitus: Secondary | ICD-10-CM | POA: Diagnosis not present

## 2023-05-23 DIAGNOSIS — F17200 Nicotine dependence, unspecified, uncomplicated: Secondary | ICD-10-CM

## 2023-05-23 DIAGNOSIS — D72829 Elevated white blood cell count, unspecified: Secondary | ICD-10-CM | POA: Diagnosis not present

## 2023-05-23 DIAGNOSIS — Z803 Family history of malignant neoplasm of breast: Secondary | ICD-10-CM | POA: Diagnosis not present

## 2023-05-23 DIAGNOSIS — Z5982 Transportation insecurity: Secondary | ICD-10-CM | POA: Diagnosis not present

## 2023-05-23 DIAGNOSIS — Z79899 Other long term (current) drug therapy: Secondary | ICD-10-CM | POA: Diagnosis not present

## 2023-05-23 DIAGNOSIS — Z8052 Family history of malignant neoplasm of bladder: Secondary | ICD-10-CM | POA: Diagnosis not present

## 2023-05-23 DIAGNOSIS — Z9089 Acquired absence of other organs: Secondary | ICD-10-CM | POA: Diagnosis not present

## 2023-05-23 DIAGNOSIS — Z5986 Financial insecurity: Secondary | ICD-10-CM | POA: Diagnosis not present

## 2023-05-23 DIAGNOSIS — Z809 Family history of malignant neoplasm, unspecified: Secondary | ICD-10-CM | POA: Diagnosis not present

## 2023-05-23 DIAGNOSIS — Z5941 Food insecurity: Secondary | ICD-10-CM | POA: Diagnosis not present

## 2023-05-23 DIAGNOSIS — F32A Depression, unspecified: Secondary | ICD-10-CM | POA: Diagnosis not present

## 2023-05-23 DIAGNOSIS — F419 Anxiety disorder, unspecified: Secondary | ICD-10-CM | POA: Diagnosis not present

## 2023-05-23 DIAGNOSIS — R768 Other specified abnormal immunological findings in serum: Secondary | ICD-10-CM | POA: Diagnosis not present

## 2023-05-23 NOTE — Assessment & Plan Note (Addendum)
 Labs reviewed and discussed with patient that Leukocytosis, negative peripheral flowcytometry, negative hepatitis,polyclonal increase of immunoglobulins leukocytosis is reactive due to extensive smoking and chronic inflammation No intervention needed. I will hold off additional work up

## 2023-05-23 NOTE — Assessment & Plan Note (Signed)
Recommend smoke cessation.  He is up to date for CT chest lung cancer screening

## 2023-05-23 NOTE — Progress Notes (Signed)
 Hematology/Oncology Progress note [phone] [phone]        REFERRING PROVIDER: Remo Lipps, PA   CHIEF COMPLAINTS/REASON FOR VISIT:  leukocytosis  ASSESSMENT & PLAN:   Leucocytosis Labs reviewed and discussed with patient that Leukocytosis, negative peripheral flowcytometry, negative hepatitis,polyclonal increase of immunoglobulins leukocytosis is reactive due to extensive smoking and chronic inflammation No intervention needed. I will hold off additional work up  Tobacco use disorder Recommend smoke cessation.  He is up to date for CT chest lung cancer screening   Patient is discharged from my clinic. I recommend patient to continue follow up with primary care physician. Patient may re-establish care in the future if clinically indicated.    Cc Remo Lipps, PA All questions were answered. The patient knows to call the clinic with any problems, questions or concerns.  Rickard Patience, MD, PhD [facility] Hemat[facility] Oncology 05/23/2023    HISTORY OF PRESENTING ILLNESS:  Frank Kelley is a  58 y.o.  male with PMH listed below who was referred to me for evaluation of leukocytosis Reviewed patient' recent labs obtained by PCP.   10/10/22 CBC showed elevated white count of 13.5, predominantly neutrophilia, absolute neutrophils 10.1 Patient did not have remote baseline white count relative compared.  09/12/2022 white count 14.6 with ANC of 10.5. Patient reports that since he has been on disability for shoulder issue, he has lost about 10 to 20 pounds placed over the past 1 year.  He attributes to being less active, eating less and a muscle loss.  Patient denies ever, chills,night sweats.   Patient has extensive smoking history.  He is scheduled to have a CT chest for lung cancer screening next week. Denies history of recent oral steroid use or steroid injection:  Denies recent infection, denies sinus congestion, cough, urinary frequency/urgency  or dysuria, diarrhea, joint swelling/pain or abnormal skin rash, prosthetics. Denies autoimmune disease history.     INTERVAL HISTORY Frank Kelley is a 57 y.o. male who has above history reviewed by me today presents for follow up visit for leukocytosis.  He has no new complaints. Weight is stable. Appetite is good.   MEDICAL HISTORY:  Past Medical History:  Diagnosis Date   Anxiety    Arthritis    Depression    GERD (gastroesophageal reflux disease)    Ulcer     SURGICAL HISTORY: Past Surgical History:  Procedure Laterality Date   TONSILLECTOMY      SOCIAL HISTORY: Social History   Socioeconomic History   Marital status: Single    Spouse name: Not on file   Number of children: 0   Years of education: Not on file   Highest education level: 12th grade  Occupational History   Not on file  Tobacco Use   Smoking status: Every Day    Current packs/day: 1.00    Average packs/day: 1 pack/day for 42.3 years (42.3 ttl pk-yrs)    Types: Cigarettes    Start date: 02/14/1981   Smokeless tobacco: Never  Vaping Use   Vaping status: Never Used  Substance and Sexual Activity   Alcohol use: Yes    Comment: occassionally   Drug use: Never   Sexual activity: Not Currently  Other Topics Concern   Not on file  Social History Narrative   Not on file   Social Drivers of Health   Financial Resource Strain: Medium Risk (03/19/2023)   Overall Financial Resource Strain (CARDIA)    Difficulty of Paying Living Expenses: Somewhat hard  Food Insecurity: Food Insecurity Present (03/19/2023)   Hunger Vital Sign    Worried About Running Out of Food in the Last Year: Often true    Ran Out of Food in the Last Year: Sometimes true  Transportation Needs: Unmet Transportation Needs (03/19/2023)   PRAPARE - Transportation    Lack of Transportation (Medical): Yes    Lack of Transportation (Non-Medical): Yes  Physical Activity: Inactive (03/19/2023)   Exercise Vital Sign    Days of Exercise per  Week: 0 days    Minutes of Exercise per Session: 30 min  Stress: Stress Concern Present (03/19/2023)   [employer] of Occupational Health - Occupational Stress Questionnaire    Feeling of Stress : Rather much  Social Connections: Socially Isolated (03/19/2023)   Social Connection and Isolation Panel [NHANES]    Frequency of Communication with Friends and Family: Once a week    Frequency of Social Gatherings with Friends and Family: Never    Attends Religious Services: Never    Database administrator or Organizations: No    Attends Engineer, structural: Not on file    Marital Status: Never married  Intimate Partner Violence: Not At Risk (03/20/2023)   Humiliation, Afraid, Rape, and Kick questionnaire    Fear of Current or Ex-Partner: No    Emotionally Abused: No    Physically Abused: No    Sexually Abused: No    FAMILY HISTORY: Family History  Problem Relation Age of Onset   Diabetes Mother    Breast cancer Mother    Diabetes Father    Cancer Father    Bladder Cancer Father    Diabetes Brother     ALLERGIES:  has no known allergies.  MEDICATIONS:  Current Outpatient Medications  Medication Sig Dispense Refill   atorvastatin (LIPITOR) 20 MG tablet TAKE 1 TABLET(20 MG) BY MOUTH DAILY 90 tablet 3   ibuprofen (ADVIL) 800 MG tablet Take 1 tablet (800 mg total) by mouth 2 (two) times daily as needed. 60 tablet 1   DULoxetine (CYMBALTA) 30 MG capsule Take 1 capsule (30 mg total) by mouth daily. (Patient not taking: Reported on 05/23/2023) 30 capsule 1   nicotine (NICODERM CQ - DOSED IN MG/24 HOURS) 21 mg/24hr patch Place 1 patch (21 mg total) onto the skin daily. Apply patch to clean, dry skin in the morning and remove each night before bed. Stop cigarette use completely while on the patch. (Patient not taking: Reported on 05/23/2023) 42 patch 1   No current facility-administered medications for this visit.    Review of Systems  Constitutional:  Negative for appetite  change, chills, fatigue, fever and unexpected weight change.  HENT:   Negative for hearing loss and voice change.   Eyes:  Negative for eye problems and icterus.  Respiratory:  Negative for chest tightness, cough and shortness of breath.   Cardiovascular:  Negative for chest pain and leg swelling.  Gastrointestinal:  Negative for abdominal distention and abdominal pain.  Endocrine: Negative for hot flashes.  Genitourinary:  Negative for difficulty urinating, dysuria and frequency.   Musculoskeletal:  Positive for arthralgias.  Skin:  Negative for itching and rash.  Neurological:  Negative for light-headedness and numbness.  Hematological:  Negative for adenopathy. Does not bruise/bleed easily.  Psychiatric/Behavioral:  Negative for confusion.     PHYSICAL EXAMINATION: ECOG PERFORMANCE STATUS: 1 - Symptomatic but completely ambulatory Vitals:   05/23/23 1055  BP: 122/75  Pulse: 74  Resp: 18  Temp: (!) 97 F (  36.1 C)  SpO2: 100%   Filed Weights   05/23/23 1055  Weight: 192 lb 12.8 oz (87.5 kg)    Physical Exam Constitutional:      General: He is not in acute distress. HENT:     Head: Normocephalic and atraumatic.  Eyes:     General: No scleral icterus. Cardiovascular:     Rate and Rhythm: Normal rate and regular rhythm.  Pulmonary:     Effort: Pulmonary effort is normal. No respiratory distress.  Abdominal:     General: Bowel sounds are normal. There is no distension.     Palpations: Abdomen is soft.  Musculoskeletal:        General: Normal range of motion.     Cervical back: Normal range of motion and neck supple.  Skin:    Findings: No erythema or rash.  Neurological:     Mental Status: He is alert and oriented to person, place, and time. Mental status is at baseline.  Psychiatric:        Mood and Affect: Mood normal.        RADIOGRAPHIC STUDIES: I have personally reviewed the radiological images as listed and agreed with the findings in the report. No  results found.  LABORATORY DATA:  I have reviewed the data as listed    Latest Ref Rng & Units 05/16/2023   11:02 AM 03/20/2023   10:53 AM 10/18/2022   10:18 AM  CBC  WBC 4.0 - 10.5 K/uL 12.5  13.2  13.2   Hemoglobin 13.0 - 17.0 g/dL 91.4  78.2  95.6   Hematocrit 39.0 - 52.0 % 48.2  46.5  50.9   Platelets 150 - 400 K/uL 235  242  224       Latest Ref Rng & Units 03/20/2023   10:53 AM 10/10/2022   10:07 AM 09/12/2022    2:49 PM  CMP  Glucose 70 - 99 mg/dL 85  92  93   BUN 6 - 24 mg/dL 14  16  18    Creatinine 0.76 - 1.27 mg/dL 2.13  0.86  5.78   Sodium 134 - 144 mmol/L 143  140  140   Potassium 3.5 - 5.2 mmol/L 4.8  4.9  4.6   Chloride 96 - 106 mmol/L 102  102  100   CO2 20 - 29 mmol/L 25  26  26    Calcium 8.7 - 10.2 mg/dL 9.7  9.4  46.9   Total Protein 6.0 - 8.5 g/dL 6.6   7.2   Total Bilirubin 0.0 - 1.2 mg/dL 0.3   <6.2   Alkaline Phos 44 - 121 IU/L 107   114   AST 0 - 40 IU/L 29   21   ALT 0 - 44 IU/L 27   20

## 2023-07-07 ENCOUNTER — Ambulatory Visit: Admitting: Physician Assistant

## 2023-07-13 ENCOUNTER — Encounter: Payer: Self-pay | Admitting: Physician Assistant

## 2023-07-13 ENCOUNTER — Ambulatory Visit (INDEPENDENT_AMBULATORY_CARE_PROVIDER_SITE_OTHER): Admitting: Physician Assistant

## 2023-07-13 VITALS — BP 114/70 | HR 76 | Temp 98.1°F | Ht 69.0 in | Wt 194.0 lb

## 2023-07-13 DIAGNOSIS — K148 Other diseases of tongue: Secondary | ICD-10-CM

## 2023-07-13 DIAGNOSIS — D72829 Elevated white blood cell count, unspecified: Secondary | ICD-10-CM

## 2023-07-13 DIAGNOSIS — F17200 Nicotine dependence, unspecified, uncomplicated: Secondary | ICD-10-CM | POA: Diagnosis not present

## 2023-07-13 DIAGNOSIS — F332 Major depressive disorder, recurrent severe without psychotic features: Secondary | ICD-10-CM

## 2023-07-13 NOTE — Assessment & Plan Note (Signed)
 Patient advised there are several options for proceeding with pharmacotherapy including return to duloxetine  at the same dose.  Different timing of the medication, increasing duloxetine  dose, or trying a different medication as he is only tried bupropion  and duloxetine  and never an SSRI.  He wants to defer any pharmacotherapy decisions to psychiatry, despite my caution that it would likely take at least 2 months to be seen by them.  He can reach out anytime if he changes his mind

## 2023-07-13 NOTE — Assessment & Plan Note (Signed)
 Encouraged smoking cessation, but patient states he is not ready.  Offered referral to our smoking cessation program but refused.  He has previously been prescribed nicotine  patches which he never picked up

## 2023-07-13 NOTE — Progress Notes (Signed)
 Date:  07/13/2023   Name:  Frank Kelley   DOB:  September 20, 1965   MRN:  [id number]   Chief Complaint: Depression and Tongue Pain  (X2 days, painful, feels like 3 ulcers are on his tongue, has been getting dental work done )  HPI Acupuncturist returns for 3 month follow up on chronic conditions:  Anxiety/depression-last visit was prescribed duloxetine , took this for about 3 weeks, but he initially noticed some improvement but then felt that the medication was making him tired, so he stopped.  He reports that after he stopped he really did not notice much difference in his mood symptoms for better or for worse.  Still reports struggling with generalized anxiety, germophobia, and fear of going out.  He was referred to [facility] in July of last year, but never followed through with the referral, he would like to proceed with this now.  In fact, he states he does not want to try any additional pharmacotherapy until he sees psychiatry.  He is dealing with some dental challenges and apparently his dentures have been ordered/made incorrectly several times in a row which is stressing him out; next set supposed to come next week  Tobacco use disorder -still smoking about a pack per day, perhaps more.  Reports "chain smoking" especially at night when he gets anxious.  LDCT last September confirmed COPD and multiple lung nodules  HLD/aortic atherosclerosis -reports good compliance with atorvastatin , lipids in February at goal.   He also brings a new concern to my attention today which is a painful area of the tongue with whitish discoloration present for 2 days.  He states the area was tender on Tuesday night, but not visible until yesterday morning.  Medication list has been reviewed and updated.  Current Meds  Medication Sig   atorvastatin  (LIPITOR) 20 MG tablet TAKE 1 TABLET(20 MG) BY MOUTH DAILY   ibuprofen  (ADVIL ) 800 MG tablet Take 1 tablet (800 mg total) by mouth 2 (two) times daily as needed.   Multiple  Vitamin (MULTIVITAMIN) tablet Take 1 tablet by mouth daily.   Omega-3 Fatty Acids (FISH OIL PO) Take by mouth.     Review of Systems  Patient Active Problem List   Diagnosis Date Noted   Degenerative arthritis of right shoulder region 11/25/2022   Aortic atherosclerosis (HCC) 11/07/2022   Coronary artery calcification seen on CT scan 11/07/2022   Emphysema/COPD (HCC) 11/07/2022   Multiple lung nodules on CT 11/07/2022   Major depressive disorder, recurrent episode, mild (HCC) 09/13/2022   Bipolar disorder with severe depression (HCC) 09/13/2022   Generalized anxiety disorder 09/13/2022   Agoraphobia 09/13/2022   Germaphobia 09/13/2022   Leucocytosis 09/13/2022   Mixed hyperlipidemia 09/13/2022   Hypercalcemia 09/13/2022   Tobacco use disorder 09/12/2022    No Known Allergies  Immunization History  Administered Date(s) Administered   Influenza, Seasonal, Injecte, Preservative Fre 11/07/2022   PNEUMOCOCCAL CONJUGATE-20 10/10/2022   Tdap 10/10/2022    Past Surgical History:  Procedure Laterality Date   TONSILLECTOMY      Social History   Tobacco Use   Smoking status: Every Day    Current packs/day: 1.00    Average packs/day: 1 pack/day for 42.4 years (42.4 ttl pk-yrs)    Types: Cigarettes    Start date: 02/14/1981   Smokeless tobacco: Never  Vaping Use   Vaping status: Never Used  Substance Use Topics   Alcohol use: Yes    Comment: occassionally   Drug use: Never  Family History  Problem Relation Age of Onset   Diabetes Mother    Breast cancer Mother    Diabetes Father    Cancer Father    Bladder Cancer Father    Diabetes Brother         07/13/2023    8:04 AM 03/20/2023    9:33 AM 01/09/2023   10:14 AM 11/07/2022   10:14 AM  GAD 7 : Generalized Anxiety Score  Nervous, Anxious, on Edge 3 3 3 2   Control/stop worrying 3 3 3 3   Worry too much - different things 3 3 3 3   Trouble relaxing 3 3 3 2   Restless 3 0 3 2  Easily annoyed or irritable 3 3 3 3    Afraid - awful might happen 3 3 3 2   Total GAD 7 Score 21 18 21 17   Anxiety Difficulty Extremely difficult Somewhat difficult Extremely difficult Extremely difficult       07/13/2023    8:04 AM 03/20/2023    9:33 AM 01/09/2023   10:13 AM  Depression screen PHQ 2/9  Decreased Interest 3 3 2   Down, Depressed, Hopeless 2 3 1   PHQ - 2 Score 5 6 3   Altered sleeping 2 0 0  Tired, decreased energy 3 2 2   Change in appetite 3 1 2   Feeling bad or failure about yourself  3 1 2   Trouble concentrating 3 3 3   Moving slowly or fidgety/restless 3 2 3   Suicidal thoughts 0 0 0  PHQ-9 Score 22 15 15   Difficult doing work/chores Extremely dIfficult Somewhat difficult Somewhat difficult    BP Readings from Last 3 Encounters:  07/13/23 114/70  05/23/23 122/75  03/20/23 110/80    Wt Readings from Last 3 Encounters:  07/13/23 194 lb (88 kg)  05/23/23 192 lb 12.8 oz (87.5 kg)  03/20/23 186 lb (84.4 kg)    BP 114/70   Pulse 76   Temp 98.1 F (36.7 C)   Ht 5\' 9"  (1.753 m)   Wt 194 lb (88 kg)   SpO2 100%   BMI 28.65 kg/m   Physical Exam Vitals and nursing note reviewed.  Constitutional:      Appearance: Normal appearance.  HENT:     Mouth/Throat:     Tongue: Lesions (1.5 cm white lesion of the left lateral aspect of the tongue which easily scrapes off using tongue depressor, tender to palpation) present.  Cardiovascular:     Rate and Rhythm: Normal rate and regular rhythm.     Heart sounds: No murmur heard.    No friction rub. No gallop.  Pulmonary:     Effort: Pulmonary effort is normal.     Breath sounds: Decreased air movement present. No wheezing, rhonchi or rales.  Abdominal:     General: There is no distension.  Musculoskeletal:        General: Normal range of motion.  Skin:    General: Skin is warm and dry.  Neurological:     Mental Status: He is alert and oriented to person, place, and time.     Gait: Gait is intact.  Psychiatric:        Mood and Affect: Mood and  affect normal.     Recent Labs     Component Value Date/Time   NA 143 03/20/2023 1053   K 4.8 03/20/2023 1053   CL 102 03/20/2023 1053   CO2 25 03/20/2023 1053   GLUCOSE 85 03/20/2023 1053   BUN 14 03/20/2023 1053  CREATININE 0.91 03/20/2023 1053   CALCIUM  9.7 03/20/2023 1053   PROT 6.6 03/20/2023 1053   ALBUMIN 4.2 03/20/2023 1053   AST 29 03/20/2023 1053   ALT 27 03/20/2023 1053   ALKPHOS 107 03/20/2023 1053   BILITOT 0.3 03/20/2023 1053    Lab Results  Component Value Date   WBC 12.5 (H) 05/16/2023   HGB 15.6 05/16/2023   HCT 48.2 05/16/2023   MCV 91.6 05/16/2023   PLT 235 05/16/2023   No results found for: "HGBA1C" Lab Results  Component Value Date   CHOL 140 03/20/2023   HDL 53 03/20/2023   LDLCALC 72 03/20/2023   TRIG 77 03/20/2023   CHOLHDL 2.6 03/20/2023   Lab Results  Component Value Date   TSH 1.430 09/12/2022     Assessment and Plan:  Severe episode of recurrent major depressive disorder, without psychotic features (HCC) Assessment & Plan: Patient advised there are several options for proceeding with pharmacotherapy including return to duloxetine  at the same dose.  Different timing of the medication, increasing duloxetine  dose, or trying a different medication as he is only tried bupropion  and duloxetine  and never an SSRI.  He wants to defer any pharmacotherapy decisions to psychiatry, despite my caution that it would likely take at least 2 months to be seen by them.  He can reach out anytime if he changes his mind   Tobacco use disorder Assessment & Plan: Encouraged smoking cessation, but patient states he is not ready.  Offered referral to our smoking cessation program but refused.  He has previously been prescribed nicotine  patches which he never picked up   Leukocytosis, unspecified type Assessment & Plan: Persistent leukocytosis evaluated by hematology, felt to be related to chronic inflammation from tobacco use disorder.  Discussed with  patient the only way to figure out if this is truly the cause is to quit smoking and monitor the white blood cells.  He verbalizes understanding.   Tongue lesion  Patient reassured that I believe his tongue lesion to be a healing abrasion/injury of the tongue.  He is to reach out if not healing as expected, we discussed that smoking will delay healing and is a major risk factor for tongue cancer.    Return in about 3 months (around 10/13/2023) for OV f/u chronic conditions.   Today's visit billed for provider time of 33 minutes inclusive of chart review, discussion multiple chronic conditions, documentation, physical exam.  Cody Das, PA-C, DMSc, Nutritionist [facility] [phone]

## 2023-07-13 NOTE — Assessment & Plan Note (Signed)
 Persistent leukocytosis evaluated by hematology, felt to be related to chronic inflammation from tobacco use disorder.  Discussed with patient the only way to figure out if this is truly the cause is to quit smoking and monitor the white blood cells.  He verbalizes understanding.

## 2023-10-02 ENCOUNTER — Telehealth: Payer: Self-pay

## 2023-10-02 ENCOUNTER — Other Ambulatory Visit: Payer: Self-pay | Admitting: Physician Assistant

## 2023-10-02 DIAGNOSIS — L570 Actinic keratosis: Secondary | ICD-10-CM

## 2023-10-02 DIAGNOSIS — D1801 Hemangioma of skin and subcutaneous tissue: Secondary | ICD-10-CM

## 2023-10-02 NOTE — Telephone Encounter (Signed)
 Referral was sent in by [facility] today.  KP

## 2023-10-02 NOTE — Telephone Encounter (Signed)
 Copied from CRM #[id number]. Topic: Referral - Question >> Oct 02, 2023 10:30 AM Cleave MATSU wrote: Reason for CRM: wants to know id Dr manya can send another referral to [facility] . Dermatologists told Mr.Alson his pcp has to send over another referral

## 2023-10-18 ENCOUNTER — Telehealth: Payer: Self-pay | Admitting: Physician Assistant

## 2023-10-18 ENCOUNTER — Telehealth: Payer: Self-pay

## 2023-10-18 NOTE — Telephone Encounter (Signed)
 [facility] called in stating they need the referral to be faxed over again by today or the referral will be cancelled.   Fax-[phone]  KP

## 2023-10-18 NOTE — Telephone Encounter (Signed)
 Copied from CRM #[id number]. Topic: Referral - Status >> Oct 09, 2023 11:46 AM Berwyn MATSU wrote: Reason for CRM: [facility] called in to speak with referral coord Nikki. I transferred to [facility] at  [phone]. >> Oct 18, 2023 11:20 AM Emylou G wrote: Relayed info to [place]: Camie w/[facility] .. you can attach to portal and / or fax referral FAX; [phone] appt on the 11th.. and needs it resent or will cancel appt today.SABRA

## 2023-11-02 ENCOUNTER — Ambulatory Visit
Admission: RE | Admit: 2023-11-02 | Discharge: 2023-11-02 | Disposition: A | Discharge status: Home / Self Care | Source: Ambulatory Visit | Attending: Acute Care | Admitting: Acute Care

## 2023-11-02 DIAGNOSIS — F17210 Nicotine dependence, cigarettes, uncomplicated: Secondary | ICD-10-CM | POA: Diagnosis not present

## 2023-11-02 DIAGNOSIS — Z122 Encounter for screening for malignant neoplasm of respiratory organs: Secondary | ICD-10-CM | POA: Diagnosis not present

## 2023-11-02 DIAGNOSIS — Z87891 Personal history of nicotine dependence: Secondary | ICD-10-CM | POA: Insufficient documentation

## 2023-11-09 ENCOUNTER — Other Ambulatory Visit: Payer: Self-pay

## 2023-11-09 DIAGNOSIS — Z87891 Personal history of nicotine dependence: Secondary | ICD-10-CM

## 2023-11-09 DIAGNOSIS — F17210 Nicotine dependence, cigarettes, uncomplicated: Secondary | ICD-10-CM

## 2023-11-09 DIAGNOSIS — Z122 Encounter for screening for malignant neoplasm of respiratory organs: Secondary | ICD-10-CM

## 2023-11-09 DIAGNOSIS — L570 Actinic keratosis: Secondary | ICD-10-CM | POA: Diagnosis not present

## 2023-11-09 DIAGNOSIS — D485 Neoplasm of uncertain behavior of skin: Secondary | ICD-10-CM | POA: Diagnosis not present

## 2023-11-16 ENCOUNTER — Ambulatory Visit (INDEPENDENT_AMBULATORY_CARE_PROVIDER_SITE_OTHER): Admitting: Physician Assistant

## 2023-11-16 ENCOUNTER — Encounter: Payer: Self-pay | Admitting: Physician Assistant

## 2023-11-16 VITALS — BP 118/82 | HR 75 | Temp 97.9°F | Ht 69.0 in | Wt 195.0 lb

## 2023-11-16 DIAGNOSIS — I2510 Atherosclerotic heart disease of native coronary artery without angina pectoris: Secondary | ICD-10-CM | POA: Diagnosis not present

## 2023-11-16 DIAGNOSIS — Z23 Encounter for immunization: Secondary | ICD-10-CM

## 2023-11-16 DIAGNOSIS — Z125 Encounter for screening for malignant neoplasm of prostate: Secondary | ICD-10-CM | POA: Diagnosis not present

## 2023-11-16 DIAGNOSIS — J439 Emphysema, unspecified: Secondary | ICD-10-CM

## 2023-11-16 DIAGNOSIS — E782 Mixed hyperlipidemia: Secondary | ICD-10-CM

## 2023-11-16 DIAGNOSIS — F17200 Nicotine dependence, unspecified, uncomplicated: Secondary | ICD-10-CM

## 2023-11-16 DIAGNOSIS — D72829 Elevated white blood cell count, unspecified: Secondary | ICD-10-CM

## 2023-11-16 MED ORDER — ASPIRIN 81 MG PO TBEC
81.0000 mg | DELAYED_RELEASE_TABLET | Freq: Every day | ORAL | 0 refills | Status: AC
Start: 2023-11-16 — End: ?

## 2023-11-16 NOTE — Assessment & Plan Note (Signed)
 Continue atorvastatin , add daily ASA 81 mg

## 2023-11-16 NOTE — Progress Notes (Signed)
 Date:  11/16/2023   Name:  Frank Kelley   DOB:  02/09/1966   MRN:  [id number]   Chief Complaint: Medical Management of Chronic Issues (Shingles shot, results for CT scan )  HPI Frank Kelley returns for 3 month follow up on chronic conditions:  Anxiety/depression- earlier this year was prescribed duloxetine , took this for about 3 weeks; he initially noticed some improvement but then felt that the medication was making him tired, so he stopped.  He reports that after he stopped he really did not notice much difference in his mood symptoms for better or for worse.  Still reports struggling with generalized anxiety, germophobia, and fear of going out.  He was referred to [facility] in July of last year and plans to see them soon.  He maintains that he does not want to try any additional pharmacotherapy until he sees psychiatry.   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 Planning for full excision at two separate visits there next month.   Tobacco use disorder -still smoking about a pack per day, perhaps more.  Reports chain smoking especially at night when he gets anxious.  Recent LDCT a few weeks ago shows COPD, degenerative changes in spine, and age-advanced coronary atherosclerosis especially LAD. He had previously reported he was not ready to quit but seems ready now. Says his brother quit cold [place] and he intends to do the same.   HLD/aortic atherosclerosis -reports good compliance with atorvastatin , lipids at goal in Feb but he is due for repeat. Fasting for labs today.   Medication list has been reviewed and updated.  Current Meds  Medication Sig   aspirin EC 81 MG tablet Take 1 tablet (81 mg total) by mouth daily. Swallow whole.   atorvastatin  (LIPITOR) 20 MG tablet TAKE 1 TABLET(20 MG) BY MOUTH DAILY   ibuprofen  (ADVIL ) 800 MG tablet Take 1 tablet (800 mg total) by mouth 2 (two) times daily as needed.   Multiple Vitamin  (MULTIVITAMIN) tablet Take 1 tablet by mouth daily.   Omega-3 Fatty Acids (FISH OIL PO) Take by mouth.     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) 11/07/2022   Multiple lung nodules on CT 11/07/2022   Major depressive disorder, recurrent episode, mild 09/13/2022   Bipolar disorder with severe depression (HCC) 09/13/2022   Generalized anxiety disorder 09/13/2022   Agoraphobia 09/13/2022   Germaphobia 09/13/2022   Leucocytosis 09/13/2022   Mixed hyperlipidemia 09/13/2022   Hypercalcemia 09/13/2022   Tobacco use disorder 09/12/2022    No Known Allergies  Immunization History  Administered Date(s) Administered   Influenza, Seasonal, Injecte, Preservative Fre 11/07/2022   Influenza,inj,Quad PF,6+ Mos 11/14/2023   PNEUMOCOCCAL CONJUGATE-20 10/10/2022   Tdap 10/10/2022   Zoster Recombinant(Shingrix) 11/16/2023    Past Surgical History:  Procedure Laterality Date   TONSILLECTOMY      Social History   Tobacco Use   Smoking status: Every Day    Current packs/day: 1.00    Average packs/day: 1 pack/day for 42.8 years (42.8 ttl pk-yrs)    Types: Cigarettes    Start date: 02/14/1981   Smokeless tobacco: Never  Vaping Use   Vaping status: Never Used  Substance Use Topics   Alcohol  use: Yes    Comment: occassionally   Drug use: Never    Family History  Problem Relation Age of Onset   Diabetes Mother    Breast cancer Mother    Diabetes Father    Cancer Father    Bladder Cancer Father    COPD Father    Diabetes Brother         11/16/2023    9:59 AM 07/13/2023    8:04 AM 03/20/2023    9:33 AM 01/09/2023   10:14 AM  GAD 7 : Generalized Anxiety Score  Nervous, Anxious, on Edge 3 3 3 3   Control/stop worrying 3 3 3 3   Worry  too much - different things 3 3 3 3   Trouble relaxing 3 3 3 3   Restless 3 3 0 3  Easily annoyed or irritable 3 3 3 3   Afraid - awful might happen 3 3 3 3   Total GAD 7 Score 21 21 18 21   Anxiety Difficulty Extremely difficult Extremely difficult Somewhat difficult Extremely difficult       11/16/2023    9:58 AM 07/13/2023    8:04 AM 03/20/2023    9:33 AM  Depression screen PHQ 2/9  Decreased Interest 3 3 3   Down, Depressed, Hopeless 2 2 3   PHQ - 2 Score 5 5 6   Altered sleeping 1 2 0  Tired, decreased energy 3 3 2   Change in appetite 0 3 1  Feeling bad or failure about yourself  1 3 1   Trouble concentrating 3 3 3   Moving slowly or fidgety/restless 1 3 2   Suicidal thoughts 0 0 0  PHQ-9 Score 14 22 15   Difficult doing work/chores Somewhat difficult Extremely dIfficult Somewhat difficult    BP Readings from Last 3 Encounters:  11/16/23 118/82  07/13/23 114/70  05/23/23 122/75    Wt Readings from Last 3 Encounters:  11/16/23 195 lb (88.5 kg)  07/13/23 194 lb (88 kg)  05/23/23 192 lb 12.8 oz (87.5 kg)    BP 118/82   Pulse 75   Temp 97.9 F (36.6 C)   Ht 5' 9 (1.753 m)   Wt 195 lb (88.5 kg)   SpO2 98%   BMI 28.80 kg/m   Physical Exam Vitals and nursing note reviewed.  Constitutional:      Appearance: Normal appearance.  Cardiovascular:     Rate and Rhythm: Normal rate.  Pulmonary:     Effort: Pulmonary effort is normal.  Abdominal:     General: There is no distension.  Musculoskeletal:        General: Normal range of motion.  Skin:    General: Skin is warm and dry.     Comments: Biopsy sites at right forearm and right lateral eye brow  Neurological:     Mental Status: He is alert and oriented to person, place, and time.     Gait: Gait is intact.  Psychiatric:        Mood and Affect: Mood and affect normal.     Recent Labs     Component Value Date/Time   NA 143 03/20/2023 1053   K 4.8 03/20/2023 1053   CL 102 03/20/2023 1053   CO2 25 03/20/2023 1053    GLUCOSE 85 03/20/2023 1053   BUN 14 03/20/2023 1053   CREATININE 0.91 03/20/2023 1053   CALCIUM  9.7 03/20/2023 1053   PROT 6.6 03/20/2023 1053   ALBUMIN 4.2 03/20/2023 1053   AST 29 03/20/2023 1053   ALT 27 03/20/2023 1053  ALKPHOS 107 03/20/2023 1053   BILITOT 0.3 03/20/2023 1053    Lab Results  Component Value Date   WBC 12.5 (H) 05/16/2023   HGB 15.6 05/16/2023   HCT 48.2 05/16/2023   MCV 91.6 05/16/2023   PLT 235 05/16/2023   No results found for: HGBA1C Lab Results  Component Value Date   CHOL 140 03/20/2023   HDL 53 03/20/2023   LDLCALC 72 03/20/2023   TRIG 77 03/20/2023   CHOLHDL 2.6 03/20/2023   Lab Results  Component Value Date   TSH 1.430 09/12/2022      Assessment and Plan:  Tobacco use disorder Assessment & Plan: Encouraged smoking cessation.  Offered referral to our smoking cessation program but refused.  He plans to quit cold [place]   Chronic obstructive pulmonary disease with emphysema, unspecified emphysema type (HCC) Assessment & Plan: Encouraged smoking cessation  Orders: -     CBC with Differential/Platelet -     Comprehensive metabolic panel with GFR -     Lipid panel  Leukocytosis, unspecified type Assessment & Plan: Repeat CBC, likely related to chronic inflammation from tobacco use disorder  Orders: -     PSA -     CBC with Differential/Platelet -     Comprehensive metabolic panel with GFR -     TSH -     Lipid panel  Mixed hyperlipidemia Assessment & Plan: Repeat fasting lipids today.  Continue atorvastatin  20 mg daily.  Orders: -     Lipid panel  Coronary artery calcification seen on CT scan Assessment & Plan: Continue atorvastatin , add daily ASA 81 mg  Orders: -     Aspirin; Take 1 tablet (81 mg total) by mouth daily. Swallow whole.  Dispense: 360 tablet; Refill: 0  Screening PSA (prostate specific antigen) -     PSA  Encounter for immunization -     Varicella-zoster vaccine IM     Return in about 3  months (around 02/16/2024).    Rolan Hoyle, PA-C, DMSc, Nutritionist [facility] Primary Care and Sports Medicine [facility] [phone]

## 2023-11-16 NOTE — Assessment & Plan Note (Signed)
 Encouraged smoking cessation

## 2023-11-16 NOTE — Assessment & Plan Note (Signed)
 Encouraged smoking cessation.  Offered referral to our smoking cessation program but refused.  He plans to quit cold [place]

## 2023-11-16 NOTE — Assessment & Plan Note (Signed)
 Repeat fasting lipids today.  Continue atorvastatin  20 mg daily.

## 2023-11-16 NOTE — Assessment & Plan Note (Signed)
 Repeat CBC, likely related to chronic inflammation from tobacco use disorder

## 2023-11-17 ENCOUNTER — Ambulatory Visit: Payer: Self-pay | Admitting: Physician Assistant

## 2023-11-17 DIAGNOSIS — E875 Hyperkalemia: Secondary | ICD-10-CM

## 2023-11-17 LAB — CBC WITH DIFFERENTIAL/PLATELET
Basophils Absolute: 0.1 x10E3/uL (ref 0.0–0.2)
Basos: 0 %
EOS (ABSOLUTE): 0.2 x10E3/uL (ref 0.0–0.4)
Eos: 1 %
Hematocrit: 49.8 % (ref 37.5–51.0)
Hemoglobin: 15.9 g/dL (ref 13.0–17.7)
Immature Grans (Abs): 0 x10E3/uL (ref 0.0–0.1)
Immature Granulocytes: 0 %
Lymphocytes Absolute: 2.7 x10E3/uL (ref 0.7–3.1)
Lymphs: 20 %
MCH: 29 pg (ref 26.6–33.0)
MCHC: 31.9 g/dL (ref 31.5–35.7)
MCV: 91 fL (ref 79–97)
Monocytes Absolute: 0.6 x10E3/uL (ref 0.1–0.9)
Monocytes: 4 %
Neutrophils Absolute: 10 x10E3/uL — ABNORMAL HIGH (ref 1.4–7.0)
Neutrophils: 75 %
Platelets: 251 x10E3/uL (ref 150–450)
RBC: 5.49 x10E6/uL (ref 4.14–5.80)
RDW: 12.3 % (ref 11.6–15.4)
WBC: 13.5 x10E3/uL — ABNORMAL HIGH (ref 3.4–10.8)

## 2023-11-17 LAB — COMPREHENSIVE METABOLIC PANEL WITH GFR
ALT: 28 IU/L (ref 0–44)
AST: 32 IU/L (ref 0–40)
Albumin: 4.4 g/dL (ref 3.8–4.9)
Alkaline Phosphatase: 124 IU/L — ABNORMAL HIGH (ref 47–123)
BUN/Creatinine Ratio: 16 (ref 9–20)
BUN: 15 mg/dL (ref 6–24)
Bilirubin Total: 0.6 mg/dL (ref 0.0–1.2)
CO2: 25 mmol/L (ref 20–29)
Calcium: 10.3 mg/dL — ABNORMAL HIGH (ref 8.7–10.2)
Chloride: 102 mmol/L (ref 96–106)
Creatinine, Ser: 0.94 mg/dL (ref 0.76–1.27)
Globulin, Total: 2.8 g/dL (ref 1.5–4.5)
Glucose: 88 mg/dL (ref 70–99)
Potassium: 5.8 mmol/L — ABNORMAL HIGH (ref 3.5–5.2)
Sodium: 141 mmol/L (ref 134–144)
Total Protein: 7.2 g/dL (ref 6.0–8.5)
eGFR: 95 mL/min/1.73 (ref >59)

## 2023-11-17 LAB — LIPID PANEL
Chol/HDL Ratio: 2.8 ratio (ref 0.0–5.0)
Cholesterol, Total: 133 mg/dL (ref 100–199)
HDL: 47 mg/dL (ref >39)
LDL Chol Calc (NIH): 70 mg/dL (ref 0–99)
Triglycerides: 79 mg/dL (ref 0–149)
VLDL Cholesterol Cal: 16 mg/dL (ref 5–40)

## 2023-11-17 LAB — PSA: Prostate Specific Ag, Serum: 0.6 ng/mL (ref 0.0–4.0)

## 2023-11-17 LAB — TSH: TSH: 1.09 u[IU]/mL (ref 0.450–4.500)

## 2023-11-20 NOTE — Telephone Encounter (Signed)
 Please review.  KP

## 2023-11-28 DIAGNOSIS — E875 Hyperkalemia: Secondary | ICD-10-CM | POA: Diagnosis not present

## 2023-11-29 LAB — BASIC METABOLIC PANEL WITH GFR
BUN/Creatinine Ratio: 21 — ABNORMAL HIGH (ref 9–20)
BUN: 19 mg/dL (ref 6–24)
CO2: 21 mmol/L (ref 20–29)
Calcium: 9.5 mg/dL (ref 8.7–10.2)
Chloride: 101 mmol/L (ref 96–106)
Creatinine, Ser: 0.92 mg/dL (ref 0.76–1.27)
Glucose: 87 mg/dL (ref 70–99)
Potassium: 4.7 mmol/L (ref 3.5–5.2)
Sodium: 137 mmol/L (ref 134–144)
eGFR: 97 mL/min/1.73 (ref >59)

## 2023-12-07 DIAGNOSIS — C44622 Squamous cell carcinoma of skin of right upper limb, including shoulder: Secondary | ICD-10-CM | POA: Diagnosis not present

## 2023-12-14 DIAGNOSIS — F319 Bipolar disorder, unspecified: Secondary | ICD-10-CM | POA: Diagnosis not present

## 2023-12-14 DIAGNOSIS — I2510 Atherosclerotic heart disease of native coronary artery without angina pectoris: Secondary | ICD-10-CM | POA: Diagnosis not present

## 2023-12-14 DIAGNOSIS — J439 Emphysema, unspecified: Secondary | ICD-10-CM | POA: Diagnosis not present

## 2023-12-20 ENCOUNTER — Telehealth: Payer: Self-pay

## 2023-12-20 DIAGNOSIS — I700 Atherosclerosis of aorta: Secondary | ICD-10-CM

## 2023-12-28 ENCOUNTER — Telehealth: Payer: Self-pay

## 2023-12-28 NOTE — Progress Notes (Signed)
 Complex Care Management Note  Care Guide Note 12/28/2023 Name: Frank Kelley MRN: [id number] DOB: 11-06-1965  Frank Kelley is a 58 y.o. year old male who sees Manya Toribio [place], [place] for primary care. I reached out to Ozell Portugal by phone today to offer complex care management services.  [name] was given information about Complex Care Management services today including:   The Complex Care Management services include support from the care team which includes your Nurse Care Manager, Clinical Social Worker, or Pharmacist.  The Complex Care Management team is here to help remove barriers to the health concerns and goals most important to you. Complex Care Management services are voluntary, and the patient may decline or stop services at any time by request to their care team member.   Complex Care Management Consent Status: Patient agreed to services and verbal consent obtained.   Follow up plan:  Telephone appointment with complex care management team member scheduled for:  01/12/2024  Encounter Outcome:  Patient Scheduled  Jeoffrey Buffalo , RMA     [place]  [facility], [facility] Guide  Direct Dial: [phone]  Website: [email or web address]

## 2024-01-12 ENCOUNTER — Other Ambulatory Visit: Payer: Self-pay

## 2024-01-12 NOTE — Patient Outreach (Signed)
 01/12/2024 Name: Frank Kelley MRN: [id number] DOB: 22-Mar-1965   [name] was referred by a Care Guide and scheduled for Complex Care Management nursing outreach.  Brief outreach with [name] today. Reports compliance with medication and adhering to recommended treatment plan.  Declined current need for assistance related to Social Drivers of Health. Reports experiencing several barriers within the past year, however, notes he is currently living with his brother to offset cost.  Declined current need for nursing, social work or it consultant. Agreed to contact the clinic if his health needs change and he requires assistance.   Follow Up Plan:   [name] declined current need for Complex Care Management outreach. Agreed to contact his Primary Care Provider if his health needs change and he requires outreach with a clinician. The care team will gladly assist.    Jackson Acron [facility] RN Care Manager Direct Dial: [phone]  Fax: [phone] Website: [email or web address]

## 2024-02-15 ENCOUNTER — Other Ambulatory Visit: Payer: Self-pay | Admitting: Physician Assistant

## 2024-02-15 DIAGNOSIS — K0889 Other specified disorders of teeth and supporting structures: Secondary | ICD-10-CM

## 2024-02-16 ENCOUNTER — Ambulatory Visit: Admitting: Physician Assistant

## 2024-02-16 NOTE — Telephone Encounter (Signed)
 Requested Prescriptions  Pending Prescriptions Disp Refills   ibuprofen  (ADVIL ) 800 MG tablet [Pharmacy Med Name: IBUPROFEN  800MG  TABLETS] 180 tablet 0    Sig: TAKE 1 TABLET(800 MG) BY MOUTH TWICE DAILY AS NEEDED     Analgesics:  NSAIDS Failed - 02/16/2024 12:20 PM      Failed - Manual Review: Labs are only required if the patient has taken medication for more than 8 weeks.      Passed - Cr in normal range and within 360 days    Creatinine, Ser  Date Value Ref Range Status  11/28/2023 0.92 0.76 - 1.27 mg/dL Final         Passed - HGB in normal range and within 360 days    Hemoglobin  Date Value Ref Range Status  11/16/2023 15.9 13.0 - 17.7 g/dL Final         Passed - PLT in normal range and within 360 days    Platelets  Date Value Ref Range Status  11/16/2023 251 150 - 450 x10E3/uL Final         Passed - HCT in normal range and within 360 days    Hematocrit  Date Value Ref Range Status  11/16/2023 49.8 37.5 - 51.0 % Final         Passed - eGFR is 30 or above and within 360 days    eGFR  Date Value Ref Range Status  11/28/2023 97 >59 mL/min/1.73 Final         Passed - Patient is not pregnant      Passed - Valid encounter within last 12 months    Recent Outpatient Visits           3 months ago Tobacco use disorder   [place] [facility] at [facility], [place]   7 months ago Severe episode of recurrent major depressive disorder, without psychotic features [facility])   [facility] at [facility], [place]   11 months ago Tobacco use disorder   [facility] at [facility], Toribio [place], [place]
